# Patient Record
Sex: Female | Born: 1985
Health system: Southern US, Community
[De-identification: ages and names within clinical notes are randomized; demographics above are authoritative.]

## PROBLEM LIST (undated history)

## (undated) DIAGNOSIS — J301 Allergic rhinitis due to pollen: Secondary | ICD-10-CM

## (undated) DIAGNOSIS — K580 Irritable bowel syndrome with diarrhea: Secondary | ICD-10-CM

## (undated) DIAGNOSIS — K589 Irritable bowel syndrome without diarrhea: Secondary | ICD-10-CM

## (undated) DIAGNOSIS — F32A Depression, unspecified: Secondary | ICD-10-CM

## (undated) DIAGNOSIS — N83202 Unspecified ovarian cyst, left side: Secondary | ICD-10-CM

## (undated) DIAGNOSIS — K219 Gastro-esophageal reflux disease without esophagitis: Secondary | ICD-10-CM

## (undated) DIAGNOSIS — F419 Anxiety disorder, unspecified: Secondary | ICD-10-CM

## (undated) DIAGNOSIS — D509 Iron deficiency anemia, unspecified: Secondary | ICD-10-CM

## (undated) DIAGNOSIS — G43909 Migraine, unspecified, not intractable, without status migrainosus: Secondary | ICD-10-CM

## (undated) DIAGNOSIS — F329 Major depressive disorder, single episode, unspecified: Secondary | ICD-10-CM

## (undated) DIAGNOSIS — B019 Varicella without complication: Secondary | ICD-10-CM

## (undated) HISTORY — DX: Irritable bowel syndrome with diarrhea: K58.0

## (undated) HISTORY — PX: NO PAST SURGERIES: SHX2092

## (undated) HISTORY — DX: Depression, unspecified: F32.A

## (undated) HISTORY — DX: Allergic rhinitis due to pollen: J30.1

## (undated) HISTORY — DX: Gastro-esophageal reflux disease without esophagitis: K21.9

## (undated) HISTORY — PX: OTHER SURGICAL HISTORY: SHX169

## (undated) HISTORY — DX: Major depressive disorder, single episode, unspecified: F32.9

## (undated) HISTORY — DX: Varicella without complication: B01.9

## (undated) HISTORY — DX: Migraine, unspecified, not intractable, without status migrainosus: G43.909

## (undated) HISTORY — DX: Anxiety disorder, unspecified: F41.9

---

## 2011-08-22 ENCOUNTER — Ambulatory Visit: Payer: PRIVATE HEALTH INSURANCE

## 2011-08-22 ENCOUNTER — Ambulatory Visit (INDEPENDENT_AMBULATORY_CARE_PROVIDER_SITE_OTHER): Payer: PRIVATE HEALTH INSURANCE | Admitting: Internal Medicine

## 2011-08-22 VITALS — BP 136/81 | HR 96 | Temp 97.9°F | Resp 16 | Ht 67.0 in | Wt 128.0 lb

## 2011-08-22 DIAGNOSIS — R51 Headache: Secondary | ICD-10-CM

## 2011-08-22 MED ORDER — BUTALBITAL-APAP-CAFFEINE 50-325-40 MG PO TABS: 1.0000 | ORAL_TABLET | Freq: Four times a day (QID) | ORAL | Status: AC | PRN

## 2011-08-22 NOTE — Progress Notes (Signed)
  Subjective:    Patient ID: Kimberly Blankenship, female    DOB: 12/08/1985, 26 y.o.   MRN: [id number]  HPI 26 y/o WF presents with headache.  Behind eyes and maxillary sinuses.  History of sinus headaches, but they usually subside with Advil and Sudafed very quickly.  She has used Advil 400mg  every four hours for four days now without relief.  Also using Sudafed 60mg  q4 hours without relief.  No history of seasonal allergies.    Student, studying office management.  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  No obvious eye strain.  No blurred vision or diplopia.  Does not wear glasses.  Vision 20/15 OU today.    LMP three weeks ago, on OCP, about to start her menses.  However, she never usually gets headaches with her menses.     Review of Systems Gen:  No fever.  Healthy.  No chronic medical issues. HEENT:  No sinus pressure, no rhinorrhea, no ear pain.  No sore throat or cough. Neuro:  Positive for Headache.  No dizziness.  No vision changes.  No phonophobia, no photophobia.    Objective:   Physical Exam Gen:  Sitting comfortably on exam table.  Not in acute distress.   HEENT:  Head [place]/AT.  Eyes with full ROM, PEERL.  Ears with normal TMs without erythema or bulging.  Nose with dry irritated mucosa, without rhinorrhea or visible polyps.  Throat without erythema or tonsillar hypertrophy. Vision 20/15 OU Lungs:  Clear to auscultation bilaterally. Neuro:  CN II-XI intact.  No focal deficits.  Cerebellar function without limitations.  Sinus film:  No abnormalities. No A-F levels seen.  Septum not deviated.    Assessment & Plan:  Periorbital headache.  Normal waters view.  Question tension related headache vs. Migraine.  Trial of Esgic to use prn headache.  Stop all OTC analgesics and sudafed.  Discussed and reviewed with Dr. Merla Riches.  Doristine Church PA-C

## 2011-08-22 NOTE — Patient Instructions (Signed)
Headache Headaches are caused by many different problems. Most commonly, headache is caused by muscle tension from an injury, fatigue, or emotional upset. Excessive muscle contractions in the scalp and neck result in a headache that often feels like a tight band around the head. Tension headaches often have areas of tenderness over the scalp and the back of the neck. These headaches may last for hours, days, or longer, and some may contribute to migraines in those who have migraine problems. Migraines usually cause a throbbing headache, which is made worse by activity. Sometimes only one side of the head hurts. Nausea, vomiting, eye pain, and avoidance of food are common with migraines. Visual symptoms such as light sensitivity, blind spots, or flashing lights may also occur. Loud noises may worsen migraine headaches. Many factors may cause migraine headaches:  Emotional stress, lack of sleep, and menstrual periods.   Alcohol and some drugs (such as birth control pills).   Diet factors (fasting, caffeine, food preservatives, chocolate).   Environmental factors (weather changes, bright lights, odors, smoke).  Other causes of headaches include minor injuries to the head. Arthritis in the neck; problems with the jaw, eyes, ears, or nose are also causes of headaches. Allergies, drugs, alcohol, and exposure to smoke can also cause moderate headaches. Rebound headaches can occur if someone uses pain medications for a long period of time and then stops. Less commonly, blood vessel problems in the neck and brain (including stroke) can cause various types of headache. Treatment of headaches includes medicines for pain and relaxation. Ice packs or heat applied to the back of the head and neck help some people. Massaging the shoulders, neck and scalp are often very useful. Relaxation techniques and stretching can help prevent these headaches. Avoid alcohol and cigarette smoking as these tend to make headaches  worse. Please see your caregiver if your headache is not better in 2 days.  SEEK IMMEDIATE MEDICAL CARE IF:   You develop a high fever, chills, or repeated vomiting.   You faint or have difficulty with vision.   You develop unusual numbness or weakness of your arms or legs.   Relief of pain is inadequate with medication, or you develop severe pain.   You develop confusion, or neck stiffness.   You have a worsening of a headache or do not obtain relief.  Document Released: 06/06/2005 Document Revised: 05/26/2011 Document Reviewed: 11/30/2006 [employer] Patient Information 2012 [employer]. 

## 2012-01-23 ENCOUNTER — Ambulatory Visit (INDEPENDENT_AMBULATORY_CARE_PROVIDER_SITE_OTHER): Payer: PRIVATE HEALTH INSURANCE | Admitting: Obstetrics and Gynecology

## 2012-01-23 ENCOUNTER — Encounter: Payer: Self-pay | Admitting: Obstetrics and Gynecology

## 2012-01-23 VITALS — BP 108/60 | HR 68 | Ht 67.0 in | Wt 129.0 lb

## 2012-01-23 DIAGNOSIS — Z124 Encounter for screening for malignant neoplasm of cervix: Secondary | ICD-10-CM

## 2012-01-23 NOTE — Patient Instructions (Signed)

## 2012-01-23 NOTE — Progress Notes (Signed)
Last Pap: 2012 per pt WNL: Yes Regular Periods:yes Contraception: pill  Monthly Breast exam:no Tetanus<49yrs:yes Nl.Bladder Function:yes Daily BMs:yes Healthy Diet:yes Calcium:yes, milk Mammogram:no Date of Mammogram: n/a Exercise:yes Have often Exercise: occassionally Seatbelt: yes Abuse at home: no Stressful work:no Sigmoid-colonoscopy: n/a Bone Density: No PCP: n/a Change in PMH: n/a Change in FMH:n/a BP 108/60  Pulse 68  Ht 5\' 7"  (1.702 m)  Wt 129 lb (58.514 kg)  BMI 20.20 kg/m2  LMP 01/09/2012 BP 108/60  Pulse 68  Ht 5\' 7"  (1.702 m)  Wt 129 lb (58.514 kg)  BMI 20.20 kg/m2  LMP 01/09/2012 Pt without complaints Physical Examination: General appearance - alert, well appearing, and in no distress Mental status - normal mood, behavior, speech, dress, motor activity, and thought processes Neck - supple, no significant adenopathy, thyroid exam: thyroid is normal in size without nodules or tenderness Chest - clear to auscultation, no wheezes, rales or rhonchi, symmetric air entry Heart - normal rate and regular rhythm Abdomen - soft, nontender, nondistended, no masses or organomegaly Breasts - breasts appear normal, no suspicious masses, no skin or nipple changes or axillary nodes Pelvic - normal external genitalia, vulva, vagina, cervix, uterus and adnexa Rectal - normal rectal, no masses, rectal exam not indicated Back exam - full range of motion, no tenderness, palpable spasm or pain on motion Neurological - alert, oriented, normal speech, no focal findings or movement disorder noted Musculoskeletal - no joint tenderness, deformity or swelling Extremities - no edema, redness or tenderness in the calves or thighs Skin - normal coloration and turgor, no rashes, no suspicious skin lesions noted Routine exam Pap sent yes Mammogram due no mircette rx called in to mail order RT 1 yr

## 2012-01-24 LAB — PAP IG W/ RFLX HPV ASCU

## 2012-01-24 MED ORDER — DESOGESTREL-ETHINYL ESTRADIOL 0.15-0.02/0.01 MG (21/5) PO TABS: 1.0000 | ORAL_TABLET | Freq: Every day | ORAL | Status: DC

## 2012-01-24 NOTE — Addendum Note (Signed)
Addended by: Rolla Plate on: 01/24/2012 03:46 PM   Modules accepted: Orders

## 2012-06-22 DIAGNOSIS — G44229 Chronic tension-type headache, not intractable: Secondary | ICD-10-CM | POA: Insufficient documentation

## 2012-06-22 DIAGNOSIS — J309 Allergic rhinitis, unspecified: Secondary | ICD-10-CM | POA: Insufficient documentation

## 2012-12-25 DIAGNOSIS — Z3041 Encounter for surveillance of contraceptive pills: Secondary | ICD-10-CM | POA: Insufficient documentation

## 2013-01-27 ENCOUNTER — Ambulatory Visit: Payer: PRIVATE HEALTH INSURANCE

## 2013-01-27 ENCOUNTER — Ambulatory Visit (INDEPENDENT_AMBULATORY_CARE_PROVIDER_SITE_OTHER): Payer: PRIVATE HEALTH INSURANCE | Admitting: Family Medicine

## 2013-01-27 VITALS — BP 100/62 | HR 85 | Temp 98.1°F | Resp 16 | Ht 67.25 in | Wt 129.0 lb

## 2013-01-27 DIAGNOSIS — M79672 Pain in left foot: Secondary | ICD-10-CM

## 2013-01-27 DIAGNOSIS — M79609 Pain in unspecified limb: Secondary | ICD-10-CM

## 2013-01-27 DIAGNOSIS — T148XXA Other injury of unspecified body region, initial encounter: Secondary | ICD-10-CM

## 2013-01-27 NOTE — Progress Notes (Signed)
This 27 year old woman who struck her left tibia on the leg of a table 6 weeks ago and has had persistent pain and a lump where she struck the table. He's able to walk without difficulty but is concerned about the persistence of the swelling and pain.  Objective: No acute distress Skin: Normal Examination lower extremity shows firm 1 cm subcutaneous to his nodule medial left tibia which is tender. There is no calf tenderness. Shows full range of motion of knee and ankle.  [facility] reading (PRIMARY) by  Dr. Milus Glazier:  Normal tib fib  Assessment:  Contusion which may take 6 months to resolve.  Plan:  reassurance.

## 2013-01-27 NOTE — Patient Instructions (Addendum)
Assessment:  Contusion which may take 6 months to resolve.

## 2014-01-23 DIAGNOSIS — E559 Vitamin D deficiency, unspecified: Secondary | ICD-10-CM | POA: Insufficient documentation

## 2014-01-23 DIAGNOSIS — G8929 Other chronic pain: Secondary | ICD-10-CM | POA: Insufficient documentation

## 2014-01-23 DIAGNOSIS — M542 Cervicalgia: Secondary | ICD-10-CM | POA: Insufficient documentation

## 2014-02-18 DIAGNOSIS — F411 Generalized anxiety disorder: Secondary | ICD-10-CM | POA: Insufficient documentation

## 2014-06-06 ENCOUNTER — Ambulatory Visit (INDEPENDENT_AMBULATORY_CARE_PROVIDER_SITE_OTHER): Payer: BC Managed Care – PPO | Admitting: Family Medicine

## 2014-06-06 VITALS — BP 96/64 | HR 85 | Temp 98.0°F | Resp 16 | Ht 66.75 in | Wt 127.0 lb

## 2014-06-06 DIAGNOSIS — M779 Enthesopathy, unspecified: Secondary | ICD-10-CM

## 2014-06-06 NOTE — Progress Notes (Signed)
° °  Subjective:    Patient ID: Kimberly Blankenship, female    DOB: 04-Oct-1985, 28 y.o.   MRN: [id number] This chart was scribed for Robyn Haber, MD by Zola Button, Medical Scribe. This patient was seen in Room 13 and the patient's care was started at 2:40 PM.    HPI HPI Comments: Kimberly Blankenship is a 28 y.o. female with a hx of tendonitis who presents to the [facility] complaining of left hand pain that began a few weeks ago. She reports having a prickly sensation as well as an aching pain that gradually developed. The aching has seeming spread further into the joint per patient. She also notes that her hand feels numb. She wore a soft brace last night. Her last flare-up of tendonitis was about 1.5 years ago. She normally takes Ibuprofen for tendonitis flare-ups; she also takes diclofenac for other pain that she has. Patient denies any known falls or tripping.  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  Review of Systems  Musculoskeletal: Positive for myalgias and arthralgias.  Neurological: Positive for numbness.       Objective:   Physical Exam CONSTITUTIONAL: Well developed/well nourished HEAD: Normocephalic/atraumatic EYES: EOM/PERRL ENMT: Mucous membranes moist NECK: supple no meningeal signs SPINE: entire spine nontender CV: S1/S2 noted, no murmurs/rubs/gallops noted LUNGS: Lungs are clear to auscultation bilaterally, no apparent distress ABDOMEN: soft, nontender, no rebound or guarding GU: no cva tenderness NEURO: Pt is awake/alert, moves all extremitiesx4 EXTREMITIES: pulses normal SKIN: warm, color normal PSYCH: no abnormalities of mood noted  Full range of motion of the left thumb. There is some mild swelling on the volar aspect of the PIP joint of the thumb. Patient has had history of tendinitis      Assessment & Plan:   This chart was scribed in my presence and reviewed by me personally.    ICD-9-CM ICD-10-CM   1. Tendonitis  726.90 M77.9    Patient is to take diclofenac which showed he has twice a day. If the paresthesias and hyperesthesia do not clear after 3 or 4 days, either return or let us know so we can take appropriate next.   Signed, Robyn Haber, MD

## 2014-06-06 NOTE — Patient Instructions (Signed)
The paresthesias (hyperesthesia) that you're feeling in the thumb are most consistent with tendinitis and adjacent nerve irritation. And start the diclofenac again and if it's not better in 3 or 4 days let us know.

## 2015-12-30 DIAGNOSIS — Z91038 Other insect allergy status: Secondary | ICD-10-CM | POA: Insufficient documentation

## 2016-06-20 DIAGNOSIS — R5383 Other fatigue: Secondary | ICD-10-CM | POA: Insufficient documentation

## 2016-11-03 ENCOUNTER — Encounter: Payer: Self-pay | Admitting: Emergency Medicine

## 2016-11-03 ENCOUNTER — Ambulatory Visit (INDEPENDENT_AMBULATORY_CARE_PROVIDER_SITE_OTHER): Payer: BLUE CROSS/BLUE SHIELD | Admitting: Emergency Medicine

## 2016-11-03 ENCOUNTER — Ambulatory Visit (INDEPENDENT_AMBULATORY_CARE_PROVIDER_SITE_OTHER): Payer: BLUE CROSS/BLUE SHIELD

## 2016-11-03 VITALS — BP 121/71 | HR 64 | Temp 98.1°F | Resp 17 | Ht 66.75 in | Wt 137.0 lb

## 2016-11-03 DIAGNOSIS — M79675 Pain in left toe(s): Secondary | ICD-10-CM

## 2016-11-03 NOTE — Patient Instructions (Signed)
     IF you received an x-ray today, you will receive an invoice from [facility]. Please contact [facility] at [phone] with questions or concerns regarding your invoice.   IF you received labwork today, you will receive an invoice from [facility]. Please contact [facility] at [phone] with questions or concerns regarding your invoice.   Our billing staff will not be able to assist you with questions regarding bills from these companies.  You will be contacted with the lab results as soon as they are available. The fastest way to get your results is to activate your My Chart account. Instructions are located on the last page of this paperwork. If you have not heard from us regarding the results in 2 weeks, please contact this office.     

## 2016-11-03 NOTE — Progress Notes (Signed)
Kimberly Blankenship 31 y.o.   Chief Complaint  Patient presents with  . Foot Pain    Big toe on left foot onset 1 month    HISTORY OF PRESENT ILLNESS: This is a 31 y.o. female complaining of pain to left big toe for 1 month; frequent hiker; walks a lot; no chronic medical condition.  HPI   Prior to Admission medications   Medication Sig Start Date End Date Taking? Authorizing Provider  ACETAMINOPHEN-BUTALBITAL 50-325 MG TABS TAKE ONE TABLET BY MOUTH EVERY 4 (FOUR) HOURS AS NEEDED. 03/07/16  Yes [provider]  clonazePAM (KLONOPIN) 0.5 MG tablet Take 0.5 mg by mouth 2 (two) times daily as needed for anxiety.   Yes [provider]  desogestrel-ethinyl estradiol (KARIVA,AZURETTE,MIRCETTE) 0.15-0.02/0.01 MG (21/5) tablet Take 1 tablet by mouth daily.   Yes [provider]  Desogestrel-Ethinyl Estradiol (VIORELE PO) Take 1 tablet by mouth daily.   Yes [provider]  MAGNESIUM PO Take by mouth.   Yes [provider]    Allergies  Allergen Reactions  . Sulfa Drugs Cross Reactors Rash    There are no active problems to display for this patient.   Past Medical History:  Diagnosis Date  . Headache, migraine     Past Surgical History:  Procedure Laterality Date  . wisdom teeth      Social History   Social History  . Marital status: Married    Spouse name: N/A  . Number of children: N/A  . Years of education: N/A   Occupational History  . Not on file.   Social History Main Topics  . Smoking status: Never Smoker  . Smokeless tobacco: Never Used  . Alcohol use 0.5 oz/week    1 Standard drinks or equivalent per week  . Drug use: No  . Sexual activity: Yes    Birth control/ protection: Pill     Comment: viorelle   Other Topics Concern  . Not on file   Social History Narrative  . No narrative on file    Family History  Problem Relation Age of Onset  . Migraines Mother      Review of Systems  Constitutional:  Negative.  Negative for chills and fever.  HENT: Negative.   Eyes: Negative.   Respiratory: Negative for cough and shortness of breath.   Cardiovascular: Negative for chest pain, palpitations and leg swelling.  Gastrointestinal: Negative for abdominal pain, nausea and vomiting.  Genitourinary: Negative for dysuria and hematuria.  Musculoskeletal: Positive for joint pain (left great toe).  Skin: Negative.  Negative for rash.  Neurological: Negative.   Endo/Heme/Allergies: Negative.   All other systems reviewed and are negative.  Vitals:   11/03/16 0914  BP: 121/71  Pulse: 64  Resp: 17  Temp: 98.1 F (36.7 C)     Physical Exam  Constitutional: She is oriented to person, place, and time. She appears well-developed and well-nourished.  HENT:  Head: Normocephalic and atraumatic.  Eyes: EOM are normal. Pupils are equal, round, and reactive to light.  Neck: Normal range of motion. Neck supple.  Cardiovascular: Normal rate and regular rhythm.   Pulmonary/Chest: Effort normal.  Musculoskeletal:  Left great toe: no erythema, mild swelling, no bruising; mild tenderness to IP joint; no tendon tenderness; no gout. NVI  Neurological: She is alert and oriented to person, place, and time. No sensory deficit. She exhibits normal muscle tone.  Skin: Skin is warm and dry. Capillary refill takes less than 2 seconds. No rash noted.  Psychiatric: She has a normal mood and affect. Her behavior is normal.  Vitals reviewed.  Dg Toe Great Left  Result Date: 11/03/2016 CLINICAL DATA:  Pain for 1 month EXAM: LEFT FIRST TOE:  3 V COMPARISON:  None. FINDINGS: Frontal, oblique, and lateral views obtained. No fracture or dislocation. Joint spaces appear normal. No erosive change. No radiopaque foreign body. IMPRESSION: No fracture or dislocation.  No appreciable arthropathic change. Electronically Signed   By: Lowella Grip III M.D.   On: 11/03/2016 10:16     ASSESSMENT & PLAN: Kimberly Blankenship was seen  today for foot pain.  Diagnoses and all orders for this visit:  Great toe pain, left -     CBC with Differential/Platelet -     Basic metabolic panel -     Uric Acid -     DG Toe Great Left; Future -     Ambulatory referral to [place], MD [facility]

## 2016-11-04 LAB — BASIC METABOLIC PANEL
BUN/Creatinine Ratio: 12 (ref 9–23)
BUN: 11 mg/dL (ref 6–20)
CHLORIDE: 101 mmol/L (ref 96–106)
CO2: 23 mmol/L (ref 18–29)
Calcium: 9.1 mg/dL (ref 8.7–10.2)
Creatinine, Ser: 0.9 mg/dL (ref 0.57–1.00)
GFR, EST AFRICAN AMERICAN: 101 mL/min/{1.73_m2} (ref >59)
GFR, EST NON AFRICAN AMERICAN: 87 mL/min/{1.73_m2} (ref >59)
Glucose: 91 mg/dL (ref 65–99)
Potassium: 4.3 mmol/L (ref 3.5–5.2)
SODIUM: 138 mmol/L (ref 134–144)

## 2016-11-04 LAB — CBC WITH DIFFERENTIAL/PLATELET
BASOS: 0 %
Basophils Absolute: 0 10*3/uL (ref 0.0–0.2)
EOS (ABSOLUTE): 0 10*3/uL (ref 0.0–0.4)
EOS: 1 %
HEMATOCRIT: 39 % (ref 34.0–46.6)
Hemoglobin: 13.4 g/dL (ref 11.1–15.9)
IMMATURE GRANULOCYTES: 0 %
Immature Grans (Abs): 0 10*3/uL (ref 0.0–0.1)
LYMPHS ABS: 2 10*3/uL (ref 0.7–3.1)
Lymphs: 27 %
MCH: 29.6 pg (ref 26.6–33.0)
MCHC: 34.4 g/dL (ref 31.5–35.7)
MCV: 86 fL (ref 79–97)
MONOS ABS: 0.4 10*3/uL (ref 0.1–0.9)
Monocytes: 6 %
NEUTROS ABS: 5 10*3/uL (ref 1.4–7.0)
NEUTROS PCT: 66 %
Platelets: 235 10*3/uL (ref 150–379)
RBC: 4.53 x10E6/uL (ref 3.77–5.28)
RDW: 12.7 % (ref 12.3–15.4)
WBC: 7.5 10*3/uL (ref 3.4–10.8)

## 2016-11-04 LAB — URIC ACID: Uric Acid: 3.8 mg/dL (ref 2.5–7.1)

## 2016-11-28 ENCOUNTER — Ambulatory Visit (INDEPENDENT_AMBULATORY_CARE_PROVIDER_SITE_OTHER): Payer: BLUE CROSS/BLUE SHIELD

## 2016-11-28 ENCOUNTER — Ambulatory Visit (INDEPENDENT_AMBULATORY_CARE_PROVIDER_SITE_OTHER): Payer: BLUE CROSS/BLUE SHIELD | Admitting: Podiatry

## 2016-11-28 ENCOUNTER — Encounter: Payer: Self-pay | Admitting: Podiatry

## 2016-11-28 VITALS — Resp 16 | Ht 67.0 in | Wt 135.0 lb

## 2016-11-28 DIAGNOSIS — M79675 Pain in left toe(s): Secondary | ICD-10-CM

## 2016-11-28 DIAGNOSIS — M779 Enthesopathy, unspecified: Secondary | ICD-10-CM

## 2016-11-28 DIAGNOSIS — M7752 Other enthesopathy of left foot: Secondary | ICD-10-CM

## 2016-11-28 DIAGNOSIS — M778 Other enthesopathies, not elsewhere classified: Secondary | ICD-10-CM

## 2016-11-28 MED ORDER — DICLOFENAC SODIUM 75 MG PO TBEC: 75.0000 mg | DELAYED_RELEASE_TABLET | Freq: Two times a day (BID) | ORAL | 2 refills | Status: DC

## 2016-11-28 MED ORDER — TRIAMCINOLONE ACETONIDE 10 MG/ML IJ SUSP
10.0000 mg | Freq: Once | INTRAMUSCULAR | Status: AC
  Administered 2016-11-28: 10 mg

## 2016-11-28 NOTE — Progress Notes (Signed)
   Subjective:    Patient ID: Kimberly Blankenship, female    DOB: [birth year], 31 y.o.   MRN: [id number]  HPI Chief Complaint  Patient presents with  . Foot Pain    Left foot; great toe-joint; pt stated, "Thinks going up and down stairs have irritated toe"; x2 months      Review of Systems  Musculoskeletal: Positive for arthralgias.  Hematological: Bruises/bleeds easily.  All other systems reviewed and are negative.      Objective:   Physical Exam        Assessment & Plan:

## 2016-11-28 NOTE — Progress Notes (Signed)
Subjective:    Patient ID: Kimberly Blankenship, female   DOB: 31 y.o.   MRN: [id number]   HPI patient presents stating she's get a lot of pain in the joint of her left big toe of several months and does not remember specific injury    Review of Systems  All other systems reviewed and are negative.       Objective:  Physical Exam  Cardiovascular: Intact distal pulses.   Musculoskeletal: Normal range of motion.  Neurological: She is alert.  Skin: Skin is warm.  Nursing note and vitals reviewed.  neurovascular status intact muscle strength was adequate range of motion within normal limits with patient found to have quite a bit of discomfort on the lateral side of the left first MPJ with fluid buildup noted around the joint surface and no restriction of motion with no crepitus or other indications of hallux rigidus limitus condition     Assessment:    Appears to be inflammatory capsulitis of the first MPJ left with no current indications of hallux rigidus or limitus condition     Plan:    H&P reviewed with patient and at this time I did do a careful injection of the lateral joint surface 3 mg Kenalog 5 mg Xylocaine instructed on physical therapy anti-inflammatories and rigid bottom shoes. Reappoint to recheck again in the next several weeks and also placed on diclofenac 75 mg twice a day  X-rays indicate that there is no signs of there is a narrowing of the joint surface fracture or spur formation consistent with structural hallux limitus

## 2016-12-19 ENCOUNTER — Ambulatory Visit: Payer: BLUE CROSS/BLUE SHIELD | Admitting: Podiatry

## 2017-01-18 DIAGNOSIS — E162 Hypoglycemia, unspecified: Secondary | ICD-10-CM | POA: Insufficient documentation

## 2017-01-30 ENCOUNTER — Encounter: Payer: Self-pay | Admitting: Physician Assistant

## 2017-01-30 ENCOUNTER — Ambulatory Visit (INDEPENDENT_AMBULATORY_CARE_PROVIDER_SITE_OTHER): Payer: BLUE CROSS/BLUE SHIELD | Admitting: Physician Assistant

## 2017-01-30 VITALS — BP 126/88 | HR 80 | Temp 97.5°F | Resp 18 | Ht 67.0 in | Wt 135.4 lb

## 2017-01-30 DIAGNOSIS — E538 Deficiency of other specified B group vitamins: Secondary | ICD-10-CM | POA: Insufficient documentation

## 2017-01-30 DIAGNOSIS — R202 Paresthesia of skin: Secondary | ICD-10-CM | POA: Diagnosis not present

## 2017-01-30 NOTE — Patient Instructions (Addendum)
Your physical exam findings are reassuring. You are likely experiencing normal response to cutting off the nerve when you are sitting and sleeping. I have given you some information about ulnar nerve and capral tunnel syndrome to read below. I do recommend wearing compression stockings at work, doing occasional calf raises at work while sitting, increasing water to 64 oz daily, and adding a multivitamin to your diet. We will contact you with your lab results within the next week.  Please seek care sooner if any of your symptom worsen or you develop new concerning symptoms. Thank you for letting me participate in your health and well being.   Paresthesia Paresthesia is a burning or prickling feeling. This feeling can happen in any part of the body. It often happens in the hands, arms, legs, or feet. Usually, it is not painful. In most cases, the feeling goes away in a short time and is not a sign of a serious problem. Follow these instructions at home:  Avoid drinking alcohol.  Try massage or needle therapy (acupuncture) to help with your problems.  Keep all follow-up visits as told by your doctor. This is important. Contact a doctor if:  You keep on having episodes of paresthesia.  Your burning or prickling feeling gets worse when you walk.  You have pain or cramps.  You feel dizzy.  You have a rash. Get help right away if:  You feel weak.  You have trouble walking or moving.  You have problems speaking, understanding, or seeing.  You feel confused.  You cannot control when you pee (urinate) or poop (bowel movement).  You lose feeling (numbness) after an injury.  You pass out (faint). This information is not intended to replace advice given to you by your health care provider. Make sure you discuss any questions you have with your health care provider. Document Released: 05/19/2008 Document Revised: 11/12/2015 Document Reviewed: 06/02/2014 Elsevier Interactive Patient  Education  2018 [place] Tunnel Syndrome Cubital tunnel syndrome is a condition that causes pain and weakness of the forearm and hand. This condition happens when one of the nerves (ulnar nerve) that runs alongside the elbow joint becomes irritated. What are the causes? Causes of this condition include:  Increased pressure on the ulnar nerve at the elbow, arm, or forearm. This can be caused by: ? Swollen tissues. ? Ligaments. ? Muscles. ? Poorly healed elbow fractures. ? Tumors in the elbow. These are usually noncancerous (benign). ? Scar tissue that develops in the elbow after an injury. ? Bony growths (spurs) near the ulnar nerve.  Stretching of the nerve due to loose elbow ligaments.  Trauma to the nerve at the elbow.  Repetitive elbow bending.  Certain medical conditions.  What increases the risk? This condition is more likely to develop in:  People who do manual labor that requires frequently bending the elbow.  People who play sports that include repeated or strenuous throwing motions, such as baseball.  People who play contact sports, such as football or lacrosse.  People who do not warm up properly before activities.  People who have diabetes.  People who have an underactive thyroid (hypothyroidism).  What are the signs or symptoms? Symptoms of this condition include:  Clumsiness or weakness of the hand.  Tenderness of the inner elbow.  Aching or soreness of the inner elbow, forearm, or fingers, especially the little finger or the ring finger.  Increased pain with forced elbow bending.  Reduced control when  throwing.  Tingling, numbness, or burning inside the forearm, or in part of the hand or fingers, especially the little finger or the ring finger.  Sharp pains that shoot from the elbow down to the wrist and hand.  The inability to grip or pinch hard.  How is this diagnosed? This condition is diagnosed with a medical history  and physical exam. Your health care provider will ask about your symptoms and ask for details about any injury. You may also have other tests, including:  Electromyogram (EMG). This test checks how well the nerve is working.  X-ray.  How is this treated? Treatment starts by stopping the activities that are causing your symptoms to get worse. Treatment may include the use of icing and medicines to reduce pain and swelling. You may also be advised to wear a splint to prevent your elbow from bending or wear an elbow pad where the ulnar nerve is closest to the skin. In less severe cases, treatment may also include working with a physical therapist:  To help decrease your symptoms.  To improve the strength and range of motion of your elbow, forearm, and hand.  If the treatments described above do not help, surgery may be needed. Follow these instructions at home: If you have a splint:  Wear it as told by your health care provider. Remove it only as told by your health care provider.  Loosen the splint if your fingers become numb and tingle, or if they turn cold and blue.  Keep the splint clean and dry. Managing pain, stiffness, and swelling  If directed, apply ice to the injured area: ? Put ice in a plastic bag. ? Place a towel between your skin and the bag. ? Leave the ice on for 20 minutes, 2-3 times per day.  Move your fingers often to avoid stiffness and to lessen swelling.  Raise (elevate) the injured area above the level of your heart while you are sitting or lying down. General instructions  Take over-the-counter and prescription medicines only as told by your health care provider.  Keep all follow-up visits as told by your health care provider. This is important.  Do any exercise or physical therapy as told by your health care provider.  Do not drive or operate heavy machinery while taking prescription pain medicine.  If you were given an elbow pad, wear it as told by  your health care provider. Contact a health care provider if:  Your symptoms get worse.  Your symptoms do not get better with treatment.  Your have new pain.  Your hand on the injured side feels numb or cold. This information is not intended to replace advice given to you by your health care provider. Make sure you discuss any questions you have with your health care provider. Document Released: 06/06/2005 Document Revised: 11/12/2015 Document Reviewed: 08/13/2014 Elsevier Interactive Patient Education  2018 [place] Syndrome Carpal tunnel syndrome is a condition that causes pain in your hand and arm. The carpal tunnel is a narrow area that is on the palm side of your wrist. Repeated wrist motion or certain diseases may cause swelling in the tunnel. This swelling can pinch the main nerve in the wrist (median nerve). Follow these instructions at home: If you have a splint:  Wear it as told by your doctor. Remove it only as told by your doctor.  Loosen the splint if your fingers: ? Become numb and tingle. ? Turn blue and  cold.  Keep the splint clean and dry. General instructions  Take over-the-counter and prescription medicines only as told by your doctor.  Rest your wrist from any activity that may be causing your pain. If needed, talk to your employer about changes that can be made in your work, such as getting a wrist pad to use while typing.  If directed, apply ice to the painful area: ? Put ice in a plastic bag. ? Place a towel between your skin and the bag. ? Leave the ice on for 20 minutes, 2-3 times per day.  Keep all follow-up visits as told by your doctor. This is important.  Do any exercises as told by your doctor, physical therapist, or occupational therapist. Contact a doctor if:  You have new symptoms.  Medicine does not help your pain.  Your symptoms get worse. This information is not intended to replace advice given to you by  your health care provider. Make sure you discuss any questions you have with your health care provider. Document Released: 05/26/2011 Document Revised: 11/12/2015 Document Reviewed: 10/22/2014 Elsevier Interactive Patient Education  [employer].

## 2017-01-30 NOTE — Progress Notes (Signed)
Kimberly Blankenship  MRN: [id number] DOB: 08-Nov-1985  Subjective:  Kimberly Blankenship is a 31 y.o. female seen in office today for a chief complaint of circulation issues x months.  Bilateral arm numbness: Numbness and tingling will ocur while sleeping occasionally. She will also will have bilateral tingling in her fingers if she is at work for too long. 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 The sensation will resolve with rest.   Bilateral leg numbness: Will occasionally have numbness and tingling in legs if she sits in a crossed leg position for a while.  This sensation also resolves if she uncrosses her legs.   Also, she occasionally has muscle cramps in bilateral calves. Denies increase in physical activity, cold intolerance, heat intolerance, lower leg swelling, skin discoloration, redness, and warmth.  Denies smoking. No recent immobilizations. No PMH of anemia, bleeding disorder, blood clots, and cancer. Of note, pt does have anxiety. Used to take zoloft but stopped taking it. Controlled on klonopin as needed. Had to take one this morning because she said she is really stressed at work and this worsens her anxiety.   Diet consists of sandwiches, burger, pizza, pasta, and fast food. Limited vegetables and fruits. Drinks tea. Will drink some water but not a lot. No multivitamin.           No FH of heart disease, stroke, blood clots, bleeding disorders in the family.   PCP is at [facility]. She has appointment with them next month.   Review of Systems  Constitutional: Negative for chills, diaphoresis, fatigue and fever.  Eyes: Negative for visual disturbance.  Respiratory: Negative for cough and shortness of breath.   Cardiovascular: Negative for chest pain and palpitations.  Gastrointestinal: Positive for nausea.  Endocrine: Negative for polydipsia, polyphagia and polyuria.  Genitourinary: Negative for difficulty urinating.  Neurological: Negative for dizziness, speech  difficulty, light-headedness and headaches.  Hematological: Bruises/bleeds easily.    Patient Active Problem List   Diagnosis Date Noted  . Great toe pain, left 11/03/2016    Current Outpatient Prescriptions on File Prior to Visit  Medication Sig Dispense Refill  . ACETAMINOPHEN-BUTALBITAL 50-325 MG TABS TAKE ONE TABLET BY MOUTH EVERY 4 (FOUR) HOURS AS NEEDED.    . clonazePAM (KLONOPIN) 0.5 MG tablet Take 0.5 mg by mouth 2 (two) times daily as needed for anxiety.    Marland Kitchen desogestrel-ethinyl estradiol (KARIVA,AZURETTE,MIRCETTE) 0.15-0.02/0.01 MG (21/5) tablet Take 1 tablet by mouth daily.    Marland Kitchen MAGNESIUM PO Take by mouth.    . Desogestrel-Ethinyl Estradiol (VIORELE PO) Take 1 tablet by mouth daily.    . diclofenac (VOLTAREN) 75 MG EC tablet Take 1 tablet (75 mg total) by mouth 2 (two) times daily. (Patient not taking: Reported on 01/30/2017) 50 tablet 2   No current facility-administered medications on file prior to visit.     Allergies  Allergen Reactions  . Sulfa Drugs Cross Reactors Rash        Objective:  BP 126/88 (BP Location: Right Arm, Patient Position: Sitting, Cuff Size: Normal)   Pulse 80   Temp (!) 97.5 F (36.4 C) (Oral)   Resp 18   Ht _0  (1.702 m)   Wt 135 lb 6.4 oz (61.4 kg)   LMP 12/30/2016   SpO2 97%   BMI 21.21 kg/m   Physical Exam  Constitutional: She is oriented to person, place, and time and well-developed, well-nourished, and in no distress.  HENT:  Head: Normocephalic and atraumatic.  Eyes: Conjunctivae  are normal.  Neck: Normal range of motion.  Cardiovascular: Normal rate, regular rhythm and normal heart sounds.   Pulses:      Radial pulses are 2+ on the right side, and 2+ on the left side.       Popliteal pulses are 2+ on the right side, and 2+ on the left side.       Dorsalis pedis pulses are 2+ on the right side, and 2+ on the left side.       Posterior tibial pulses are 2+ on the right side, and 2+ on the left side.  Pulmonary/Chest:  Effort normal.  Musculoskeletal: Normal range of motion.       Right lower leg: She exhibits no tenderness and no swelling.       Left lower leg: She exhibits no tenderness and no swelling.  Calf measures 35 cm bilaterally.   Neurological: She is alert and oriented to person, place, and time. She has normal sensation, normal strength and intact cranial nerves. She displays no atrophy, no tremor, facial symmetry and normal speech. Gait normal.  Reflex Scores:      Tricep reflexes are 2+ on the right side and 2+ on the left side.      Bicep reflexes are 2+ on the right side and 2+ on the left side.      Brachioradialis reflexes are 2+ on the right side and 2+ on the left side.      Patellar reflexes are 2+ on the right side and 2+ on the left side.      Achilles reflexes are 2+ on the right side and 2+ on the left side. Positive Phalen's test in left hand. Negative Tinel's sign and elbow and wrist.   Skin: Skin is warm and dry. No cyanosis.  Bilateral hands with cap refill <2 sec.   Psychiatric: Affect normal. Her mood appears anxious.  Vitals reviewed.   Assessment and Plan :   1. Paresthesias Labs pending. Will fax results to PCP. PE findings are reassuring, no acute findings. Pt reassured that her description of leg symptoms seem to be a normal response that occurs when individuals sit in a crossed legged position for too long. Arm and hand symptoms during sleep may be consistent with ulnar nerve entrapment and/or carpal tunnel syndrome. She does a lot of typing at her job and had positive phalen's test in left hand. Given education on night splints to use for carpal tunnel syndrome and ulnar nerve entrapment. Also encouraged to increase water consumption and start taking multivitamin daily. Return to clinic if symptoms worsen, do not improve, or as needed. - CBC with Differential/Platelet - CMP14+EGFR - CK - EKG 12-Lead - Vitamin B12 - TSH  Tenna Delaine, PA-C  Primary [facility] at  [place] 02/01/2017 6:09 PM

## 2017-01-31 LAB — CBC WITH DIFFERENTIAL/PLATELET
BASOS ABS: 0 10*3/uL (ref 0.0–0.2)
Basos: 0 %
EOS (ABSOLUTE): 0 10*3/uL (ref 0.0–0.4)
EOS: 0 %
HEMOGLOBIN: 14.3 g/dL (ref 11.1–15.9)
Hematocrit: 42.4 % (ref 34.0–46.6)
IMMATURE GRANULOCYTES: 0 %
Immature Grans (Abs): 0 10*3/uL (ref 0.0–0.1)
LYMPHS ABS: 1.5 10*3/uL (ref 0.7–3.1)
LYMPHS: 17 %
MCH: 29.9 pg (ref 26.6–33.0)
MCHC: 33.7 g/dL (ref 31.5–35.7)
MCV: 89 fL (ref 79–97)
MONOCYTES: 3 %
Monocytes Absolute: 0.3 10*3/uL (ref 0.1–0.9)
NEUTROS PCT: 80 %
Neutrophils Absolute: 6.9 10*3/uL (ref 1.4–7.0)
Platelets: 291 10*3/uL (ref 150–379)
RBC: 4.78 x10E6/uL (ref 3.77–5.28)
RDW: 13.4 % (ref 12.3–15.4)
WBC: 8.7 10*3/uL (ref 3.4–10.8)

## 2017-01-31 LAB — CMP14+EGFR
ALBUMIN: 4.8 g/dL (ref 3.5–5.5)
ALK PHOS: 44 IU/L (ref 39–117)
ALT: 10 IU/L (ref 0–32)
AST: 14 IU/L (ref 0–40)
Albumin/Globulin Ratio: 1.6 (ref 1.2–2.2)
BUN/Creatinine Ratio: 12 (ref 9–23)
BUN: 10 mg/dL (ref 6–20)
Bilirubin Total: 0.2 mg/dL (ref 0.0–1.2)
CALCIUM: 10.1 mg/dL (ref 8.7–10.2)
CO2: 20 mmol/L (ref 20–29)
CREATININE: 0.86 mg/dL (ref 0.57–1.00)
Chloride: 103 mmol/L (ref 96–106)
GFR calc Af Amer: 106 mL/min/{1.73_m2} (ref >59)
GFR, EST NON AFRICAN AMERICAN: 92 mL/min/{1.73_m2} (ref >59)
GLUCOSE: 83 mg/dL (ref 65–99)
Globulin, Total: 3 g/dL (ref 1.5–4.5)
Potassium: 4 mmol/L (ref 3.5–5.2)
Sodium: 141 mmol/L (ref 134–144)
Total Protein: 7.8 g/dL (ref 6.0–8.5)

## 2017-01-31 LAB — CK: CK TOTAL: 68 U/L (ref 24–173)

## 2017-01-31 LAB — VITAMIN B12: Vitamin B-12: 194 pg/mL — ABNORMAL LOW (ref 232–1245)

## 2017-02-01 ENCOUNTER — Ambulatory Visit: Payer: Self-pay | Admitting: Medical

## 2017-02-01 DIAGNOSIS — F410 Panic disorder [episodic paroxysmal anxiety] without agoraphobia: Secondary | ICD-10-CM | POA: Insufficient documentation

## 2017-02-08 ENCOUNTER — Ambulatory Visit: Payer: BLUE CROSS/BLUE SHIELD | Admitting: Podiatry

## 2017-02-09 ENCOUNTER — Encounter: Payer: Self-pay | Admitting: Physician Assistant

## 2017-02-09 ENCOUNTER — Telehealth: Payer: Self-pay | Admitting: Physician Assistant

## 2017-02-09 NOTE — Telephone Encounter (Signed)
Called patient to discuss lab results. Left voicemail. All labs except for vitamin b12 were normal. Vitamin b12 was low. I recommend starting supplementation. However, I do know she has a PCP at [facility] and was planning on seeing them in September. I am happy to fax the results to their office so they can proceed with treatment or we can initiate treatment. Either way, I have asked her to contact our office back and let us know what she would like to do.

## 2017-02-13 ENCOUNTER — Telehealth: Payer: Self-pay | Admitting: Physician Assistant

## 2017-02-13 NOTE — Telephone Encounter (Signed)
Pt returned originally phone call. She has appointment with PCP in 2 weeks. She wants to go ahead and start supplementing with B12. She was questioning what dose. I informed her that she can take 1,000-2,000 daily for the next two weeks then have her PCP recheck her B12 values to determine further dosing instructions. Pt understands and agrees to plan.

## 2017-02-16 LAB — SPECIMEN STATUS REPORT

## 2017-02-16 LAB — METHYLMALONIC ACID, SERUM: Methylmalonic Acid: 120 nmol/L (ref 0–378)

## 2017-02-16 LAB — HOMOCYSTEINE: Homocysteine: 9.3 umol/L (ref 0.0–15.0)

## 2017-03-03 ENCOUNTER — Encounter: Payer: Self-pay | Admitting: Podiatry

## 2017-03-03 ENCOUNTER — Other Ambulatory Visit: Payer: Self-pay | Admitting: Podiatry

## 2017-03-03 ENCOUNTER — Ambulatory Visit (INDEPENDENT_AMBULATORY_CARE_PROVIDER_SITE_OTHER): Payer: BLUE CROSS/BLUE SHIELD | Admitting: Podiatry

## 2017-03-03 ENCOUNTER — Ambulatory Visit (INDEPENDENT_AMBULATORY_CARE_PROVIDER_SITE_OTHER): Payer: BLUE CROSS/BLUE SHIELD

## 2017-03-03 DIAGNOSIS — M7752 Other enthesopathy of left foot: Secondary | ICD-10-CM

## 2017-03-03 DIAGNOSIS — M779 Enthesopathy, unspecified: Secondary | ICD-10-CM

## 2017-03-03 DIAGNOSIS — M205X1 Other deformities of toe(s) (acquired), right foot: Secondary | ICD-10-CM

## 2017-03-03 DIAGNOSIS — M79672 Pain in left foot: Secondary | ICD-10-CM

## 2017-03-03 DIAGNOSIS — M778 Other enthesopathies, not elsewhere classified: Secondary | ICD-10-CM

## 2017-03-03 NOTE — Progress Notes (Signed)
Subjective:    Patient ID: Kimberly Blankenship, female   DOB: 31 y.o.   MRN: [id number]   HPI patient states still getting quite a bit of discomfort in the left big toe and around the area and I thought it was improving after medication but it seems like it's just come back not severe but bothersome    ROS      Objective:  Physical Exam neurovascular status intact with patient having excellent range of motion first MPJ but some discomfort on the lateral side of the joint and slightly proximal upon palpation with no crepitus indicate     Assessment:   Possibility for functional hallux limitus condition left      Plan:     Reviewed condition and at this point I recommended orthotics with probable reverse Morton's extension or medical edge type device to try to reduce stress against the first MPJ with consideration for immobilization if symptoms persist  X-rays do not indicate any change from previous as far as x-rays go

## 2017-03-07 ENCOUNTER — Ambulatory Visit: Payer: BLUE CROSS/BLUE SHIELD | Admitting: Orthotics

## 2017-03-07 DIAGNOSIS — M79672 Pain in left foot: Secondary | ICD-10-CM

## 2017-03-07 DIAGNOSIS — M205X1 Other deformities of toe(s) (acquired), right foot: Secondary | ICD-10-CM

## 2017-03-07 DIAGNOSIS — M779 Enthesopathy, unspecified: Secondary | ICD-10-CM

## 2017-03-07 DIAGNOSIS — M778 Other enthesopathies, not elsewhere classified: Secondary | ICD-10-CM

## 2017-03-07 NOTE — Progress Notes (Signed)
Patient is here today to be evaluated and cast for CMFO.  Patient has hx of functional hallux limitus (FHL), and needs a supportive orthoses that will plantarflex first ray in order to lower hinge pin of first MPJ and enhance windless effect.  Plan of deep heel cup, hug arch, and reverse mortons extension.  Richy to fab.   Patient to pay $300.00 self pay instead of filing to insurance due high deductible.

## 2017-03-13 ENCOUNTER — Encounter: Payer: Self-pay | Admitting: Physician Assistant

## 2017-03-24 DIAGNOSIS — R894 Abnormal immunological findings in specimens from other organs, systems and tissues: Secondary | ICD-10-CM | POA: Insufficient documentation

## 2017-03-24 DIAGNOSIS — R7989 Other specified abnormal findings of blood chemistry: Secondary | ICD-10-CM | POA: Insufficient documentation

## 2017-03-28 ENCOUNTER — Encounter: Payer: Self-pay | Admitting: Physician Assistant

## 2017-04-03 ENCOUNTER — Other Ambulatory Visit: Payer: Self-pay | Admitting: Orthotics

## 2017-04-03 DIAGNOSIS — M79676 Pain in unspecified toe(s): Secondary | ICD-10-CM

## 2017-04-29 DIAGNOSIS — K219 Gastro-esophageal reflux disease without esophagitis: Secondary | ICD-10-CM | POA: Insufficient documentation

## 2017-05-03 NOTE — Progress Notes (Signed)
[place] at [facility] [address], [place], [place] [address] [phone]  Date:  05/04/2017   Name:  Kimberly Blankenship   DOB:  11/21/1985   MRN:  [id number]  PCP:  Darreld Mclean, MD    Chief Complaint: Establish Care (Pt here to est care. Would like to discuss acid reflux, go over lab results brought in by pt, and c/o fatigue x 1 yr. )   History of Present Illness:  Kimberly Blankenship is a 31 y.o. very pleasant female patient who presents with the following:  Here today as a new patient to establish care She actually had a CPE per Dr. Sabra Heck in September of this year:  Patient presents with  . Annual Exam  not fasting  Last pap normal in 2017. Monogamous, declines STD testing.  Desires flu vaccine. Tdap up to date.  Her migraine headaches are occasional and relieved by BuPAP.  Since her last visit she has been diagnosed with panic attacks and has a small Rx for Xanax which she uses about 1 time per week. She has been seen by [facility] and diagnosed with B12 deficiency. She has had very recent labs including CBC, CMP, TSH B12 and folate. She will bring [place] a copy of those results. She has been taking B12, lemon balm and methylfolate. She has follow up scheduled. She has not had any counseling for her anxiety or panic but would be willing. She is trying to make changes to her diet and increase exercise to improve her symptoms.   Patient Active Problem List  Diagnosis Date Noted  . Panic attacks 02/01/2017  . B12 deficiency 01/30/2017  . Great toe pain, left 11/03/2016  . GAD (generalized anxiety disorder) 02/18/2014  failed celexa 02/2014, prn klonopin.  . Chronic neck pain 01/23/2014  Mild DJD cervical by xrays 12/2013  . Vitamin D deficiency 01/23/2014  . Oral contraceptive use 12/25/2012  . Allergic rhinitis 06/22/2012  . Chronic tension headaches 06/22/2012  . Migraine  Never before headache; never brain  image; + FH migraine, +FH PGF brain tumor   She has concern of GERD- She notes that her sx started about one week ago.  She thought that she might be constipated- she tried drinking coffee which made her sx worse.  She notes an epigastric pain, which she describes as sharp and gnawing.  This started about a week- this past Saturday she went to UC, was started on nexium and zantac. Her sx are a bit  She has never had this in the past Sx worse in the afternoon/ evening She does take some NSAIDs but not that frequently, mostly just for monthly menstrual cramps  No vomiting She has noted a bit of loose stool but no diarrhea She is worse after eating in general  She is not aware of any family history of reflux, ulcers  She did not have any labs done at her UC visit  She went to [facility]- they told her that her "immune system was struggling and that I re-activated my old mono infection." she has a large packet of labs that they drew for her  She is taking several supplements for her "EBV" infection.   She is using xanax about once a week for a panic attack- she is on 0.25 mg, go t#20 on 8/28 Her life is generally stable and happy, but she does admit that she may suffer from some low level  anxiety and perhaps mild dysthymia. She feels that she sleeps well, and her marriage is good. She has been trying to find out why she feels tired which is why she went to [facility]  LMP 11/5 She is on OCP Reviewed [place]- all ok Patient Active Problem List   Diagnosis Date Noted  . Great toe pain, left 11/03/2016    Past Medical History:  Diagnosis Date  . Headache, migraine     Past Surgical History:  Procedure Laterality Date  . wisdom teeth      Social History   Tobacco Use  . Smoking status: Never Smoker  . Smokeless tobacco: Never Used  Substance Use Topics  . Alcohol use: Yes    Alcohol/week: 0.5 oz    Types: 1 Standard drinks or equivalent per week  . Drug use: No     Family History  Problem Relation Age of Onset  . Migraines Mother     Allergies  Allergen Reactions  . Sulfa Drugs Cross Reactors Rash    Medication list has been reviewed and updated.  Current Outpatient Medications on File Prior to Visit  Medication Sig Dispense Refill  . ACETAMINOPHEN-BUTALBITAL 50-325 MG TABS TAKE ONE TABLET BY MOUTH EVERY 4 (FOUR) HOURS AS NEEDED.    Marland Kitchen desogestrel-ethinyl estradiol (KARIVA,AZURETTE,MIRCETTE) 0.15-0.02/0.01 MG (21/5) tablet Take 1 tablet by mouth daily.    . Desogestrel-Ethinyl Estradiol (VIORELE PO) Take 1 tablet by mouth daily.    . diclofenac (VOLTAREN) 75 MG EC tablet Take 1 tablet (75 mg total) by mouth 2 (two) times daily. 50 tablet 2  . MAGNESIUM PO Take by mouth.     No current facility-administered medications on file prior to visit.     Review of Systems:  As per HPI- otherwise negative. She has always been pretty slim- she is not trying to lose weight She is working 30- 35 hours a week.    Her job is not rewarding at all- she is not getting a lot of satisfaction from her current job She is married-no children as of yet.  marriage is good  She has been pretty busy with work and her home- she does not feel like she has a lot of extra energy   Physical Examination: Vitals:   05/04/17 0829  BP: 110/78  Pulse: 99  Temp: 97.6 F (36.4 C)  SpO2: 99%   Vitals:   05/04/17 0829  Weight: 134 lb 3.2 oz (60.9 kg)  Height: 5' 7.5" (1.715 m)   Body mass index is 20.71 kg/m. Ideal Body Weight: Weight in (lb) to have BMI = 25: 161.7  GEN: WDWN, NAD, Non-toxic, A & O x 3, slim build, looks well HEENT: Atraumatic, Normocephalic. Neck supple. No masses, No LAD.  Bilateral TM wnl, oropharynx normal.  PEERL,EOMI.   Ears and Nose: No external deformity. CV: RRR, No M/G/R. No JVD. No thrill. No extra heart sounds. PULM: CTA B, no wheezes, crackles, rhonchi. No retractions. No resp. distress. No accessory muscle use. ABD: S, NT, ND,  +BS. No rebound. No HSM. EXTR: No c/c/e NEURO Normal gait.  PSYCH: Normally interactive. Conversant. Not depressed or anxious appearing.  Calm demeanor.    Assessment and Plan: Epigastric pain - Plan: US Abdomen Limited RUQ, sucralfate (CARAFATE) 1 g tablet, Ambulatory referral to Gastroenterology  RUQ pain - Plan: US Abdomen Limited RUQ, sucralfate (CARAFATE) 1 g tablet  GAD (generalized anxiety disorder) - Plan: ALPRAZolam (XANAX) 0.25 MG tablet  Panic attack - Plan: ALPRAZolam (XANAX) 0.25 MG  tablet  Here today to establish care She has concersn about anxiety, fatigue, and also heartburn/ epigastric pain  She is taking a PPI and H2 blocker with some relief of her GI symptoms however certainly not total  Will add carafate and order RUQ [place]- if normal plan to refer to GI She takes occasional xanax- refilled for her today Explored the idea of an ssri or similar with her today- she will think about this   Signed Lamar Blinks, MD   Received her [place]  US Abdomen Limited Ruq  Result Date: 05/04/2017 CLINICAL DATA:  Epigastric pain EXAM: ULTRASOUND ABDOMEN LIMITED RIGHT UPPER QUADRANT COMPARISON:  None. FINDINGS: Gallbladder: No gallstones or wall thickening visualized. No sonographic Murphy sign noted by sonographer. Common bile duct: Diameter: 2 mm Liver: No focal lesion identified. Within normal limits in parenchymal echogenicity. Portal vein is patent on color Doppler imaging with normal direction of blood flow towards the liver. IMPRESSION: Normal right upper quadrant ultrasound. Electronically Signed   By: Monte Fantasia M.D.   On: 05/04/2017 09:50

## 2017-05-04 ENCOUNTER — Ambulatory Visit (HOSPITAL_BASED_OUTPATIENT_CLINIC_OR_DEPARTMENT_OTHER)
Admission: RE | Admit: 2017-05-04 | Discharge: 2017-05-04 | Disposition: A | Discharge status: Home / Self Care | Payer: BLUE CROSS/BLUE SHIELD | Source: Ambulatory Visit | Attending: Family Medicine | Admitting: Family Medicine

## 2017-05-04 ENCOUNTER — Encounter: Payer: Self-pay | Admitting: Family Medicine

## 2017-05-04 ENCOUNTER — Ambulatory Visit: Payer: BLUE CROSS/BLUE SHIELD | Admitting: Family Medicine

## 2017-05-04 VITALS — BP 110/78 | HR 99 | Temp 97.6°F | Ht 67.5 in | Wt 134.2 lb

## 2017-05-04 DIAGNOSIS — F411 Generalized anxiety disorder: Secondary | ICD-10-CM | POA: Diagnosis not present

## 2017-05-04 DIAGNOSIS — R1011 Right upper quadrant pain: Secondary | ICD-10-CM | POA: Diagnosis not present

## 2017-05-04 DIAGNOSIS — R1013 Epigastric pain: Secondary | ICD-10-CM | POA: Diagnosis not present

## 2017-05-04 DIAGNOSIS — F410 Panic disorder [episodic paroxysmal anxiety] without agoraphobia: Secondary | ICD-10-CM | POA: Diagnosis not present

## 2017-05-04 MED ORDER — SUCRALFATE 1 G PO TABS: 1.0000 g | ORAL_TABLET | Freq: Three times a day (TID) | ORAL | 0 refills | Status: DC

## 2017-05-04 MED ORDER — ALPRAZOLAM 0.25 MG PO TABS
0.2500 mg | ORAL_TABLET | Freq: Two times a day (BID) | ORAL | 0 refills | Status: DC | PRN
Start: 2017-05-04 — End: 2017-07-20

## 2017-05-04 MED FILL — ALPRAZolam 0.25 MG TABS: 0.25 | 15 days supply | Qty: 30 | Fill #0

## 2017-05-04 NOTE — Patient Instructions (Addendum)
Please go to the ground floor imaging dept for your ultrasound after we finish up here today- then you can go home, and I will be in touch with your results asap I gave you an rx for carafate to take up to 4x a day- with meals and at bedtime- for the next 7- 10 days.    Please think about what you would like to do as far as anxiety- we might try a different type of medication for you if you are up for it.  We will touch base soon

## 2017-05-05 ENCOUNTER — Encounter: Payer: Self-pay | Admitting: Family Medicine

## 2017-05-05 ENCOUNTER — Encounter: Payer: Self-pay | Admitting: Physician Assistant

## 2017-05-10 ENCOUNTER — Other Ambulatory Visit: Payer: Self-pay | Admitting: Emergency Medicine

## 2017-05-10 ENCOUNTER — Encounter: Payer: Self-pay | Admitting: Emergency Medicine

## 2017-05-18 ENCOUNTER — Other Ambulatory Visit: Payer: Self-pay | Admitting: Emergency Medicine

## 2017-05-18 DIAGNOSIS — K219 Gastro-esophageal reflux disease without esophagitis: Secondary | ICD-10-CM

## 2017-05-19 ENCOUNTER — Other Ambulatory Visit: Payer: BLUE CROSS/BLUE SHIELD

## 2017-05-19 DIAGNOSIS — K219 Gastro-esophageal reflux disease without esophagitis: Secondary | ICD-10-CM

## 2017-05-22 LAB — H. PYLORI BREATH TEST: H. PYLORI BREATH TEST: NOT DETECTED

## 2017-05-23 ENCOUNTER — Ambulatory Visit: Payer: BLUE CROSS/BLUE SHIELD | Admitting: Physician Assistant

## 2017-05-23 ENCOUNTER — Other Ambulatory Visit (INDEPENDENT_AMBULATORY_CARE_PROVIDER_SITE_OTHER): Payer: BLUE CROSS/BLUE SHIELD

## 2017-05-23 ENCOUNTER — Encounter: Payer: Self-pay | Admitting: Physician Assistant

## 2017-05-23 VITALS — BP 90/60 | HR 72 | Ht 66.75 in | Wt 130.4 lb

## 2017-05-23 DIAGNOSIS — K2990 Gastroduodenitis, unspecified, without bleeding: Secondary | ICD-10-CM | POA: Diagnosis not present

## 2017-05-23 DIAGNOSIS — K2970 Gastritis, unspecified, without bleeding: Secondary | ICD-10-CM

## 2017-05-23 DIAGNOSIS — R1013 Epigastric pain: Secondary | ICD-10-CM | POA: Diagnosis not present

## 2017-05-23 LAB — CBC WITH DIFFERENTIAL/PLATELET
BASOS PCT: 0.7 % (ref 0.0–3.0)
Basophils Absolute: 0 10*3/uL (ref 0.0–0.1)
EOS ABS: 0.1 10*3/uL (ref 0.0–0.7)
EOS PCT: 2.8 % (ref 0.0–5.0)
HCT: 43.5 % (ref 36.0–46.0)
HEMOGLOBIN: 14.8 g/dL (ref 12.0–15.0)
LYMPHS ABS: 1.9 10*3/uL (ref 0.7–4.0)
Lymphocytes Relative: 36 % (ref 12.0–46.0)
MCHC: 34 g/dL (ref 30.0–36.0)
MCV: 90.9 fl (ref 78.0–100.0)
MONO ABS: 0.5 10*3/uL (ref 0.1–1.0)
Monocytes Relative: 9.2 % (ref 3.0–12.0)
NEUTROS ABS: 2.6 10*3/uL (ref 1.4–7.7)
NEUTROS PCT: 51.3 % (ref 43.0–77.0)
PLATELETS: 214 10*3/uL (ref 150.0–400.0)
RBC: 4.78 Mil/uL (ref 3.87–5.11)
RDW: 13.3 % (ref 11.5–15.5)
WBC: 5.2 10*3/uL (ref 4.0–10.5)

## 2017-05-23 LAB — COMPREHENSIVE METABOLIC PANEL
ALBUMIN: 4.9 g/dL (ref 3.5–5.2)
ALT: 19 U/L (ref 0–35)
AST: 18 U/L (ref 0–37)
Alkaline Phosphatase: 31 U/L — ABNORMAL LOW (ref 39–117)
BUN: 13 mg/dL (ref 6–23)
CHLORIDE: 103 meq/L (ref 96–112)
CO2: 30 mEq/L (ref 19–32)
CREATININE: 0.87 mg/dL (ref 0.40–1.20)
Calcium: 9.7 mg/dL (ref 8.4–10.5)
GFR: 81.68 mL/min (ref >60.00)
GLUCOSE: 89 mg/dL (ref 70–99)
POTASSIUM: 4.8 meq/L (ref 3.5–5.1)
SODIUM: 140 meq/L (ref 135–145)
Total Bilirubin: 0.6 mg/dL (ref 0.2–1.2)
Total Protein: 7.6 g/dL (ref 6.0–8.3)

## 2017-05-23 LAB — LIPASE: LIPASE: 15 U/L (ref 11.0–59.0)

## 2017-05-23 LAB — SEDIMENTATION RATE: Sed Rate: 1 mm/hr (ref 0–20)

## 2017-05-23 NOTE — Progress Notes (Addendum)
Subjective:    Patient ID: Kimberly Blankenship, female    DOB: 1986/05/29, 31 y.o.   MRN: [id number]  HPI Kimberly Blankenship is a pleasant 31 year old white female, new to GI today referred by Silvestre Mesi MD for evaluation of epigastric pain.  Patient has history of neurolysed anxiety disorder, has otherwise been in good health. She says her current symptoms started about 3 weeks ago with what she describes as a gnawing and cramping type of pain in her epigastrium which seemed to progress throughout the day.  He said it got to the point where it was fairly constant.  There is no associated nausea or vomiting no fever or chills.  No diarrhea or melena.  Appetite has been okay and she cannot definitely correlate that symptoms are better or worse with eating.  She tried over-the-counter Nexium for a few days and Zantac for several days as well as Tums.  She felt all of these helped a little bit. She does not use any regular aspirin or NSAIDs but does take ibuprofen for at least a couple of days every month around her menstrual period. After being seen by Dr. Edilia Bo she was started on Pepcid 20 mg twice daily and says she actually is feeling better this week and that most of her symptoms have resolved.  She is concerned about what caused this. Upper abdominal ultrasound on 05/04/2017 showed no gallstones, common bile duct of 2 mm and liver within normal limits.  H. pylori breath testing was negative on 05/19/2017. She does not drink any regular alcohol, family history is negative for GI disease.  Review of Systems Pertinent positive and negative review of systems were noted in the above HPI section.  All other review of systems was otherwise negative.  Outpatient Encounter Medications as of 05/23/2017  Medication Sig  . ACETAMINOPHEN-BUTALBITAL 50-325 MG TABS TAKE ONE TABLET BY MOUTH EVERY 4 (FOUR) HOURS AS NEEDED.  Marland Kitchen ALPRAZolam (XANAX) 0.25 MG tablet Take 1 tablet (0.25 mg total) 2 (two) times daily as needed by  mouth for anxiety.  . Ascorbic Acid (VITAMIN C) 1000 MG tablet Take 1,000 mg by mouth daily.  . Cholecalciferol (VITAMIN D3) 5000 units CAPS Take 1 capsule by mouth daily.  . diclofenac (VOLTAREN) 75 MG EC tablet Take 1 tablet (75 mg total) by mouth 2 (two) times daily.  . Ergocalciferol (VITAMIN D2) 2000 units TABS Take 1 tablet by mouth daily.  Marland Kitchen MAGNESIUM PO Take by mouth.  . Menaquinone-7 (VITAMIN K2) 100 MCG CAPS Take 1 tablet by mouth daily.  . Selenium 200 MCG TABS Take 1 tablet by mouth daily.  . sucralfate (CARAFATE) 1 g tablet Take 1 tablet (1 g total) 4 (four) times daily -  with meals and at bedtime by mouth. Use as needed for GERD  . vitamin B-12 (CYANOCOBALAMIN) 1000 MCG tablet Take 1,000 mcg by mouth daily.  Marland Kitchen zinc gluconate 50 MG tablet Take 50 mg by mouth daily.  . [DISCONTINUED] desogestrel-ethinyl estradiol (KARIVA,AZURETTE,MIRCETTE) 0.15-0.02/0.01 MG (21/5) tablet Take 1 tablet by mouth daily.  . [DISCONTINUED] Desogestrel-Ethinyl Estradiol (VIORELE PO) Take 1 tablet by mouth daily.   No facility-administered encounter medications on file as of 05/23/2017.    Allergies  Allergen Reactions  . Sulfa Drugs Cross Reactors Rash   Patient Active Problem List   Diagnosis Date Noted  . Great toe pain, left 11/03/2016   Social History   Socioeconomic History  . Marital status: Married    Spouse name: Not on file  .  Number of children: 0  . Years of education: Not on file  . Highest education level: Not on file  Social Needs  . Financial resource strain: Not on file  . Food insecurity - worry: Not on file  . Food insecurity - inability: Not on file  . Transportation needs - medical: Not on file  . Transportation needs - non-medical: Not on file  Occupational History  . Occupation: admin assist  Tobacco Use  . Smoking status: Never Smoker  . Smokeless tobacco: Never Used  Substance and Sexual Activity  . Alcohol use: Yes    Alcohol/week: 0.5 oz    Types: 1  Standard drinks or equivalent per week    Comment: 1-2 once a week  . Drug use: No  . Sexual activity: Yes    Birth control/protection: Pill    Comment: viorelle  Other Topics Concern  . Not on file  Social History Narrative  . Not on file    Ms. Well's family history includes Arthritis in her maternal grandmother, mother, and paternal grandmother; COPD in her paternal grandmother; Depression in her brother; Diabetes in her mother; Hypertension in her maternal grandfather; Miscarriages / [place] in her mother; Other in her paternal grandfather; Stomach cancer in her maternal grandfather; Uterine cancer in her paternal aunt.      Objective:    Vitals:   05/23/17 0830  BP: 90/60  Pulse: 72    Physical Exam ; well-developed young white female in no acute distress, pleasant blood pressure 90/60, pulse 72, height 5 foot 6, weight 130, BMI 20.5.  HEENT ;nontraumatic normocephalic EOMI PERRLA sclera anicteric, Cardiovascular; regular rate and rhythm with S1-S2 no murmur rub or gallop, Pulmonary ;clear bilaterally, Abdomen ;soft there is some very minimal epigastric tenderness no guarding or rebound no palpable mass or hepatosplenomegaly bowel sounds are present, Rectal; exam not done, Neuro psych; mood and affect appropriate       Assessment & Plan:   #61 31 year old white female with 3-week history of epigastric gnawing and cramping type pain improved after starting Pepcid twice daily over the past week and a half. Upper abdominal ultrasound was unremarkable and H. pylori breath testing negative. I suspect she had an acute gastritis which is now resolving. This may have been precipitated by NSAIDs which she uses periodically.  Plan; Reassurance Continue Pepcid or Zantac twice daily for another 1-2 weeks and then use as needed. Also suggested she use an H2 blocker for a few days around her menstrual cycle when she is taking NSAIDs regularly. CBC with differential, CMETand lipase  today Happy to see her back in follow-up if she has any recurrence.  I do not think endoscopy is indicated at this time as it was unlikely to change our management.  Amy S Esterwood PA-C 05/23/2017   Cc: Copland, Gay Filler, MD  Addendum: Reviewed and agree with initial management. Pyrtle, Lajuan Lines, MD

## 2017-05-23 NOTE — Patient Instructions (Signed)
Please go to the basement level to have your labs drawn.   Take over the counter Zantac 150 mg twice daily as needed for recurrent epigastric pain.   Follow up with Nicoletta Ba PA as needed.

## 2017-07-20 ENCOUNTER — Other Ambulatory Visit: Payer: Self-pay | Admitting: Family Medicine

## 2017-07-20 DIAGNOSIS — F411 Generalized anxiety disorder: Secondary | ICD-10-CM

## 2017-07-20 DIAGNOSIS — F410 Panic disorder [episodic paroxysmal anxiety] without agoraphobia: Secondary | ICD-10-CM

## 2017-07-21 ENCOUNTER — Encounter: Payer: Self-pay | Admitting: Family Medicine

## 2017-07-21 MED ORDER — ALPRAZOLAM 0.25 MG PO TABS: 0.2500 mg | ORAL_TABLET | Freq: Two times a day (BID) | ORAL | 0 refills | Status: DC | PRN

## 2017-07-21 NOTE — Telephone Encounter (Signed)
Requesting: alprazolam   Contract: No signed contract  UDS: no screening  Last Visit: 05/04/17 Next Visit: No upcoming visit Last Refill: 05/04/17  Please Advise

## 2017-07-21 NOTE — Telephone Encounter (Signed)
Pt is requesting refill on alprazolam.

## 2017-08-14 ENCOUNTER — Encounter: Payer: Self-pay | Admitting: Family Medicine

## 2017-08-19 NOTE — Progress Notes (Signed)
[place] at [facility] [address], [place], [place] [address] [phone]  Date:  08/23/2017   Name:  Kimberly Blankenship   DOB:  Feb 11, 1986   MRN:  [id number]  PCP:  Darreld Mclean, MD    Chief Complaint: Follow-up (Pt would like to discuss medication for anxiety and depression. )   History of Present Illness:  Kimberly Blankenship is a 32 y.o. very pleasant female patient who presents with the following:  I last saw her in November at which time she was taking prn xanax. We had discussed an SSRi, but she was not ready to try one at our last visit She has been trying to treat this with diet/ exercise, but is now at the point of wanting to try an SSRi or similar  She was on 10 mg of celexa in the past, but it did not seem to help- this was in 2014/ 15 They then changed her to zoloft- it worked but she did not want to continue taking it long term due due to low libido.  She took this for a couple of years  She continues to get some anxiety attacks Her depression manifests more as low motivation. She often feels like she does not want to do anything.   Not really having crying spells She does feel emotionally stressed.   Appetite is ok She seems to be sleeping ok- she does not always feel rested, but does not think she is sleeping too much   No SI or HI   She is using xanax maybe once a week for panic attacks- these have gotten better  Patient Active Problem List   Diagnosis Date Noted  . Great toe pain, left 11/03/2016    Past Medical History:  Diagnosis Date  . Anxiety   . Chicken pox   . Depression   . GERD (gastroesophageal reflux disease)   . Hay fever   . Headache, migraine     Past Surgical History:  Procedure Laterality Date  . wisdom teeth      Social History   Tobacco Use  . Smoking status: Never Smoker  . Smokeless tobacco: Never Used  Substance Use Topics  . Alcohol use: Yes    Alcohol/week: 0.5 oz   Types: 1 Standard drinks or equivalent per week    Comment: 1-2 once a week  . Drug use: No    Family History  Problem Relation Age of Onset  . Arthritis Mother   . Diabetes Mother   . Miscarriages / [place] Mother   . Depression Brother   . Arthritis Maternal Grandmother   . Hypertension Maternal Grandfather   . Stomach cancer Maternal Grandfather   . Arthritis Paternal Grandmother   . COPD Paternal Grandmother   . Other Paternal Grandfather        Brain tumor  . Uterine cancer Paternal Aunt     Allergies  Allergen Reactions  . Sulfa Drugs Cross Reactors Rash    Medication list has been reviewed and updated.  Current Outpatient Medications on File Prior to Visit  Medication Sig Dispense Refill  . ACETAMINOPHEN-BUTALBITAL 50-325 MG TABS TAKE ONE TABLET BY MOUTH EVERY 4 (FOUR) HOURS AS NEEDED.    Marland Kitchen ALPRAZolam (XANAX) 0.25 MG tablet Take 1 tablet (0.25 mg total) by mouth 2 (two) times daily as needed for anxiety. 30 tablet 0  . Ascorbic Acid (VITAMIN C) 1000 MG tablet Take 1,000 mg by mouth  daily.    . Cholecalciferol (VITAMIN D3) 5000 units CAPS Take 1 capsule by mouth daily.    . Ergocalciferol (VITAMIN D2) 2000 units TABS Take 1 tablet by mouth daily.    Marland Kitchen MAGNESIUM PO Take by mouth.    . Menaquinone-7 (VITAMIN K2) 100 MCG CAPS Take 1 tablet by mouth daily.    . Selenium 200 MCG TABS Take 1 tablet by mouth daily.    . sucralfate (CARAFATE) 1 g tablet Take 1 tablet (1 g total) 4 (four) times daily -  with meals and at bedtime by mouth. Use as needed for GERD 40 tablet 0  . vitamin B-12 (CYANOCOBALAMIN) 1000 MCG tablet Take 1,000 mcg by mouth daily.    Marland Kitchen zinc gluconate 50 MG tablet Take 50 mg by mouth daily.     No current facility-administered medications on file prior to visit.     Review of Systems:  As per HPI- otherwise negative.   Physical Examination: Vitals:   08/23/17 1201  BP: 102/78  Pulse: 84  Temp: 97.7 F (36.5 C)  SpO2: 98%   Vitals:    08/23/17 1201  Weight: 135 lb 12.8 oz (61.6 kg)  Height: 5\' 7"  (1.702 m)   Body mass index is 21.27 kg/m. Ideal Body Weight: Weight in (lb) to have BMI = 25: 159.3  GEN: WDWN, NAD, Non-toxic, A & O x 3, slender build, looks well  HEENT: Atraumatic, Normocephalic. Neck supple. No masses, No LAD.   Ears and Nose: No external deformity. CV: RRR, No M/G/R. No JVD. No thrill. No extra heart sounds. PULM: CTA B, no wheezes, crackles, rhonchi. No retractions. No resp. distress. No accessory muscle use.Marland Kitchen EXTR: No c/c/e NEURO Normal gait.  PSYCH: Normally interactive. Conversant. Not depressed or anxious appearing.  Calm demeanor.    Assessment and Plan: GAD (generalized anxiety disorder) - Plan: FLUoxetine (PROZAC) 20 MG tablet, DISCONTINUED: FLUoxetine (PROZAC) 20 MG tablet  Dysthymia - Plan: FLUoxetine (PROZAC) 20 MG tablet, DISCONTINUED: FLUoxetine (PROZAC) 20 MG tablet  Will start her on prozac today- 20 mg, increase to 40 mg after 2-3 weeks She will update me via mychart in a month or so- sooner if not doing ok! She is using her xanax just occasionally, she does not need a refill as of yet   Signed Lamar Blinks, MD

## 2017-08-23 ENCOUNTER — Ambulatory Visit: Payer: No Typology Code available for payment source | Admitting: Family Medicine

## 2017-08-23 ENCOUNTER — Encounter: Payer: Self-pay | Admitting: Family Medicine

## 2017-08-23 VITALS — BP 102/78 | HR 84 | Temp 97.7°F | Ht 67.0 in | Wt 135.8 lb

## 2017-08-23 DIAGNOSIS — F341 Dysthymic disorder: Secondary | ICD-10-CM

## 2017-08-23 DIAGNOSIS — F411 Generalized anxiety disorder: Secondary | ICD-10-CM

## 2017-08-23 MED ORDER — FLUOXETINE HCL 20 MG PO TABS: 20.0000 mg | ORAL_TABLET | Freq: Every day | ORAL | 5 refills | Status: DC

## 2017-08-23 MED FILL — FLUoxetine HCL 20 MG TABS: 20 | 34 days supply | Qty: 60 | Fill #0

## 2017-08-23 NOTE — Patient Instructions (Signed)
Good to see you again today!   Please start on 20 mg of prozac once a day- you can increase to 2 pills after 2-3 weeks if you like. Please let me know how this works for you over the next month or so- you can message me.  Please alert me sooner if you are not doing ok or if symptoms are worse

## 2017-08-25 ENCOUNTER — Encounter: Payer: Self-pay | Admitting: Family Medicine

## 2017-08-25 DIAGNOSIS — F410 Panic disorder [episodic paroxysmal anxiety] without agoraphobia: Secondary | ICD-10-CM

## 2017-08-25 DIAGNOSIS — F411 Generalized anxiety disorder: Secondary | ICD-10-CM

## 2017-08-29 MED ORDER — ALPRAZOLAM 0.25 MG PO TABS: 0.2500 mg | ORAL_TABLET | Freq: Two times a day (BID) | ORAL | 0 refills | Status: DC | PRN

## 2017-08-29 MED FILL — ALPRAZolam 0.25 MG TABS: 0.25 | 15 days supply | Qty: 30 | Fill #0

## 2017-08-29 NOTE — Addendum Note (Signed)
Addended by: Lamar Blinks C on: 08/29/2017 12:47 PM   Modules accepted: Orders

## 2017-08-30 ENCOUNTER — Ambulatory Visit: Payer: Self-pay | Admitting: Family Medicine

## 2017-09-21 ENCOUNTER — Ambulatory Visit: Payer: No Typology Code available for payment source | Admitting: Family Medicine

## 2017-09-21 ENCOUNTER — Encounter: Payer: Self-pay | Admitting: Family Medicine

## 2017-09-21 VITALS — BP 118/76 | HR 69 | Temp 98.0°F | Ht 67.0 in | Wt 131.4 lb

## 2017-09-21 DIAGNOSIS — J0100 Acute maxillary sinusitis, unspecified: Secondary | ICD-10-CM | POA: Diagnosis not present

## 2017-09-21 MED ORDER — AMOXICILLIN-POT CLAVULANATE 875-125 MG PO TABS: 1.0000 | ORAL_TABLET | Freq: Two times a day (BID) | ORAL | 0 refills | Status: DC

## 2017-09-21 MED ORDER — LEVOCETIRIZINE DIHYDROCHLORIDE 5 MG PO TABS: 5.0000 mg | ORAL_TABLET | Freq: Every evening | ORAL | 2 refills | Status: DC

## 2017-09-21 MED ORDER — FLUTICASONE PROPIONATE 50 MCG/ACT NA SUSP: 2.0000 | Freq: Every day | NASAL | 2 refills | Status: DC

## 2017-09-21 NOTE — Progress Notes (Signed)
Chief Complaint  Patient presents with  . Sinus Problem    Kimberly Blankenship here for URI complaints.  Duration: 6 days  Associated symptoms: sinus congestion, sinus pain, rhinorrhea and dental pain Denies: itchy watery eyes, ear fullness, ear pain, ear drainage, sore throat, wheezing, shortness of breath, myalgia and fevers Treatment to date: Saline rinse, Sudafed Sick contacts: No  ROS:  Const: Denies fevers HEENT: As noted in HPI Lungs: No SOB  Past Medical History:  Diagnosis Date  . Anxiety   . Chicken pox   . Depression   . GERD (gastroesophageal reflux disease)   . Hay fever   . Headache, migraine    Family History  Problem Relation Age of Onset  . Arthritis Mother   . Diabetes Mother   . Miscarriages / [place] Mother   . Depression Brother   . Arthritis Maternal Grandmother   . Hypertension Maternal Grandfather   . Stomach cancer Maternal Grandfather   . Arthritis Paternal Grandmother   . COPD Paternal Grandmother   . Other Paternal Grandfather        Brain tumor  . Uterine cancer Paternal Aunt     BP 118/76 (BP Location: Left Arm, Patient Position: Sitting, Cuff Size: Normal)   Pulse 69   Temp 98 F (36.7 C) (Oral)   Ht 5\' 7"  (1.702 m)   Wt 131 lb 6 oz (59.6 kg)   SpO2 99%   BMI 20.58 kg/m  General: Awake, alert, appears stated age HEENT: AT, [place], ears patent b/l and TM's neg, max sinuses ttp b/l; nares patent w/o discharge, pharynx pink and without exudates, MMM Neck: No masses or asymmetry Heart: RRR Lungs: CTAB, no accessory muscle use Psych: Age appropriate judgment and insight, normal mood and affect  Acute maxillary sinusitis, recurrence not specified - Plan: fluticasone (FLONASE) 50 MCG/ACT nasal spray, levocetirizine (XYZAL) 5 MG tablet, amoxicillin-clavulanate (AUGMENTIN) 875-125 MG tablet  Orders as above. Cover allergies, supportive care, pocket rx given. F/u prn. Pt voiced understanding and agreement to the plan.  [place], DO 09/21/17 11:26 AM

## 2017-09-21 NOTE — Patient Instructions (Addendum)
Most sinus infections are viral in etiology and antibiotics will not be helpful. That being said, if you start having worsening symptoms over 3 days, you are worsening by day 10 or not improving by day 14, go ahead and take it. You are on Day 6 as of now.   Claritin (loratadine), Allegra (fexofenadine), Zyrtec (cetirizine); these are listed in order from weakest to strongest. Generic, and therefore cheaper, options are in the parentheses.   Flonase (fluticasone); nasal spray that is over the counter. 2 sprays each nostril, once daily. Aim towards the same side eye when you spray.  There are available OTC, and the generic versions, which may be cheaper, are in parentheses. Show this to a pharmacist if you have trouble finding any of these items.  Let us know if you need anything.

## 2017-09-21 NOTE — Progress Notes (Signed)
Pre visit review using our clinic review tool, if applicable. No additional management support is needed unless otherwise documented below in the visit note. 

## 2017-09-27 ENCOUNTER — Encounter: Payer: Self-pay | Admitting: Family Medicine

## 2017-10-09 NOTE — Progress Notes (Addendum)
[place] at [facility] [address] [place], [place] [address] [phone]  Date:  10/11/2017   Name:  Kimberly Blankenship   DOB:  Aug 19, 1985   MRN:  [id number]  PCP:  Darreld Mclean, MD    Chief Complaint: Menstrual Problem (c/o painful periods clear discharge and spotting between periods. Pt states sx's have been present for a couple of months. )   History of Present Illness:  Kimberly Blankenship is a 32 y.o. very pleasant female patient who presents with the following:  Generally healthy young woman here today with concern of spotting in the week prior to her menses and more cramping/ pain with her menses.  The painful menses are not new, but got better for a while and then started to get worse again over the last couple of years. She will notice some vaginal discharge during the month that is clear and sometimes smells like onions.  She does not have any itching or pain.  She also notes that her hair- like on her legs- is growing quickly and wonders if her testosterone is high  Her menses come every 26 days or so. She may bleed for about one week which is normal for her.  Menarche at age 31,she had a lot of pain and cramping in her teens but this got better for a while at the age of 44. She notes that she "passed out" for the pain of her menses a couple of times as a teen She is not planning a pregnancy any time very soon, but also does not want to be on a long term contraceptive at thsi time She had tried the nuva ring, implanon, 2 different types of pills- however these did not seem to reduce her menstrual symtoms.  She does not wish to go back on contraception really  She has never been to see GYN about her concerns.    Her last pap was sometime in 2016 she thinks, never had an abnormal   Her LMP was 09/28/17.   Last seen by myself in March: I last saw her in November at which time she was taking prn xanax. We had discussed an SSRi,  but she was not ready to try one at our last visit She has been trying to treat this with diet/ exercise, but is now at the point of wanting to try an SSRi or similar  She was on 10 mg of celexa in the past, but it did not seem to help- this was in 2014/ 15 They then changed her to zoloft- it worked but she did not want to continue taking it long term due due to low libido.  She took this for a couple of years  She continues to get some anxiety attacks Her depression manifests more as low motivation. She often feels like she does not want to do anything.   Not really having crying spells She does feel emotionally stressed.   Appetite is ok She seems to be sleeping ok- she does not always feel rested, but does not think she is sleeping too much   We gave her some prozac to try at last visit; however she did not like it - seemed to cause bloating- and stopped using it after a few weeks  Her stomach issues cleared up with her stopping the prozac. She is not sure if this might be coincidence and plans to maybe try the prozac again as she  continues to have anxiety   She is married, husband Shanon Brow    Patient Active Problem List   Diagnosis Date Noted  . Great toe pain, left 11/03/2016    Past Medical History:  Diagnosis Date  . Anxiety   . Chicken pox   . Depression   . GERD (gastroesophageal reflux disease)   . Hay fever   . Headache, migraine     Past Surgical History:  Procedure Laterality Date  . wisdom teeth      Social History   Tobacco Use  . Smoking status: Never Smoker  . Smokeless tobacco: Never Used  Substance Use Topics  . Alcohol use: Yes    Alcohol/week: 0.5 oz    Types: 1 Standard drinks or equivalent per week    Comment: 1-2 once a week  . Drug use: No    Family History  Problem Relation Age of Onset  . Arthritis Mother   . Diabetes Mother   . Miscarriages / [place] Mother   . Depression Brother   . Arthritis Maternal Grandmother   . Hypertension  Maternal Grandfather   . Stomach cancer Maternal Grandfather   . Arthritis Paternal Grandmother   . COPD Paternal Grandmother   . Other Paternal Grandfather        Brain tumor  . Uterine cancer Paternal Aunt     Allergies  Allergen Reactions  . Sulfa Drugs Cross Reactors Rash    Medication list has been reviewed and updated.  Current Outpatient Medications on File Prior to Visit  Medication Sig Dispense Refill  . ACETAMINOPHEN-BUTALBITAL 50-325 MG TABS TAKE ONE TABLET BY MOUTH EVERY 4 (FOUR) HOURS AS NEEDED.    Marland Kitchen ALPRAZolam (XANAX) 0.25 MG tablet Take 1 tablet (0.25 mg total) by mouth 2 (two) times daily as needed for anxiety. 30 tablet 0  . Ascorbic Acid (VITAMIN C) 1000 MG tablet Take 1,000 mg by mouth daily.    . Cholecalciferol (VITAMIN D3) 5000 units CAPS Take 1 capsule by mouth daily.    . Ergocalciferol (VITAMIN D2) 2000 units TABS Take 1 tablet by mouth daily.    Marland Kitchen FLUoxetine (PROZAC) 20 MG tablet Take 1 tablet (20 mg total) by mouth daily. Increase to 2 pills after 2-3 weeks 60 tablet 5  . fluticasone (FLONASE) 50 MCG/ACT nasal spray Place 2 sprays into both nostrils daily. 16 g 2  . levocetirizine (XYZAL) 5 MG tablet Take 1 tablet (5 mg total) by mouth every evening. 30 tablet 2  . MAGNESIUM PO Take by mouth.    . Menaquinone-7 (VITAMIN K2) 100 MCG CAPS Take 1 tablet by mouth daily.    . Selenium 200 MCG TABS Take 1 tablet by mouth daily.    . vitamin B-12 (CYANOCOBALAMIN) 1000 MCG tablet Take 1,000 mcg by mouth daily.    Marland Kitchen zinc gluconate 50 MG tablet Take 50 mg by mouth daily.     No current facility-administered medications on file prior to visit.     Review of Systems:  As per HPI- otherwise negative.  No fever or chills   Physical Examination: Vitals:   10/11/17 0831  BP: (!) 103/52  Pulse: 96  Temp: 97.9 F (36.6 C)  SpO2: 97%   Vitals:   10/11/17 0831  Weight: 132 lb (59.9 kg)  Height: 5\' 7"  (1.702 m)   Body mass index is 20.67 kg/m. Ideal  Body Weight: Weight in (lb) to have BMI = 25: 159.3  GEN: WDWN, NAD, Non-toxic, A & O x  3, thin build, looks well  HEENT: Atraumatic, Normocephalic. Neck supple. No masses, No LAD.  Bilateral TM wnl, oropharynx normal.  PEERL,EOMI.   Ears and Nose: No external deformity. CV: RRR, No M/G/R. No JVD. No thrill. No extra heart sounds. PULM: CTA B, no wheezes, crackles, rhonchi. No retractions. No resp. distress. No accessory muscle use. ABD: S, NT, ND, +BS. No rebound. No HSM. Mobile structure noted in her lower abdomen- likely her uterus which is more palpable as she is so slim EXTR: No c/c/e NEURO Normal gait.  PSYCH: Normally interactive. Conversant. Not depressed or anxious appearing.  Calm demeanor.  Pelvic: normal, no vaginal lesions or discharge. Uterus normal, no CMT, no adnexal tendereness or masses   Results for orders placed or performed in visit on 10/11/17  POCT urine pregnancy  Result Value Ref Range   Preg Test, Ur Negative Negative     Assessment and Plan: Menses painful - Plan: POCT urine pregnancy, Ambulatory referral to Obstetrics / Gynecology  Screening for cervical cancer - Plan: Cytology - PAP  Abdominal fullness - Plan: US Abdomen Complete  Here today with a couple of concerns. She has painful menses with cramping, which has failed to respond to nuva ring and OCP in the past.  ?fibroids or endometriosis.  Referral to OBG for further evaluation Pap pending today, reassured her that she likely has physiologic vaginal discharge but will test for any other cause on her pap today as well Presume that GYN will likely do a pelvic US for her. Will obtain abd [place] as well to further investigate structure noted on exam as above  See patient instructions for more details.     Signed Lamar Blinks, MD  4/27 Received her pap, message to patient  Adequacy Satisfactory for evaluation endocervical/transformation zone component ABSENT.   Diagnosis NEGATIVE FOR  INTRAEPITHELIAL LESIONS OR MALIGNANCY.   Bacterial vaginitis Negative for Bacterial Vaginitis Microorganisms   Comment: Normal Reference Range - Negative  Candida vaginitis Negative for Candida species   Comment: Normal Reference Range - Negative  Trichomonas Negative   Comment: Normal Reference Range - Negative  Chlamydia Negative   Comment: Normal Reference Range - Negative  Neisseria gonorrhea Negative   Comment: Normal Reference Range - Negative

## 2017-10-11 ENCOUNTER — Ambulatory Visit: Payer: 59 | Admitting: Family Medicine

## 2017-10-11 ENCOUNTER — Encounter: Payer: Self-pay | Admitting: Family Medicine

## 2017-10-11 ENCOUNTER — Other Ambulatory Visit (HOSPITAL_COMMUNITY)
Admission: RE | Admit: 2017-10-11 | Discharge: 2017-10-11 | Disposition: A | Discharge status: Home / Self Care | Payer: 59 | Source: Ambulatory Visit | Attending: Family Medicine | Admitting: Family Medicine

## 2017-10-11 VITALS — BP 110/70 | HR 96 | Temp 97.9°F | Ht 67.0 in | Wt 132.0 lb

## 2017-10-11 DIAGNOSIS — Z124 Encounter for screening for malignant neoplasm of cervix: Secondary | ICD-10-CM

## 2017-10-11 DIAGNOSIS — N946 Dysmenorrhea, unspecified: Secondary | ICD-10-CM | POA: Diagnosis not present

## 2017-10-11 DIAGNOSIS — R198 Other specified symptoms and signs involving the digestive system and abdomen: Secondary | ICD-10-CM

## 2017-10-11 LAB — POCT URINE PREGNANCY: Preg Test, Ur: NEGATIVE

## 2017-10-11 NOTE — Patient Instructions (Signed)
Good to see you today- I will be in touch with your pap asap, and we will get you scheduled to see OB-GYN. They will likely do a pelvic US for you, and I am going to set up an abdominal ultrasound to examine the rest of your belly.   Please let me know if any concerns in the meantime or if you don't hear about either of your appointments

## 2017-10-12 ENCOUNTER — Encounter: Payer: Self-pay | Admitting: Family Medicine

## 2017-10-13 LAB — CYTOLOGY - PAP
Adequacy: ABSENT
Bacterial vaginitis: NEGATIVE
CANDIDA VAGINITIS: NEGATIVE
Chlamydia: NEGATIVE
Diagnosis: NEGATIVE
NEISSERIA GONORRHEA: NEGATIVE
TRICH (WINDOWPATH): NEGATIVE

## 2017-10-14 ENCOUNTER — Encounter: Payer: Self-pay | Admitting: Family Medicine

## 2017-10-18 MED ORDER — BUTALBITAL-ACETAMINOPHEN 50-325 MG PO TABS: ORAL_TABLET | ORAL | 0 refills | Status: DC

## 2017-10-18 NOTE — Addendum Note (Signed)
Addended by: Lamar Blinks C on: 10/18/2017 05:14 PM   Modules accepted: Orders

## 2017-10-19 ENCOUNTER — Encounter: Payer: Self-pay | Admitting: Family Medicine

## 2017-10-19 DIAGNOSIS — A6920 Lyme disease, unspecified: Secondary | ICD-10-CM | POA: Diagnosis not present

## 2017-10-19 DIAGNOSIS — E039 Hypothyroidism, unspecified: Secondary | ICD-10-CM | POA: Diagnosis not present

## 2017-11-01 ENCOUNTER — Ambulatory Visit: Payer: 59 | Admitting: Obstetrics & Gynecology

## 2017-11-01 ENCOUNTER — Other Ambulatory Visit (HOSPITAL_COMMUNITY)
Admission: RE | Admit: 2017-11-01 | Discharge: 2017-11-01 | Disposition: A | Discharge status: Home / Self Care | Payer: 59 | Source: Ambulatory Visit | Attending: Obstetrics & Gynecology | Admitting: Obstetrics & Gynecology

## 2017-11-01 ENCOUNTER — Encounter: Payer: Self-pay | Admitting: Obstetrics & Gynecology

## 2017-11-01 VITALS — BP 119/76 | HR 72 | Ht 67.0 in | Wt 133.0 lb

## 2017-11-01 DIAGNOSIS — Z113 Encounter for screening for infections with a predominantly sexual mode of transmission: Secondary | ICD-10-CM | POA: Diagnosis not present

## 2017-11-01 DIAGNOSIS — N921 Excessive and frequent menstruation with irregular cycle: Secondary | ICD-10-CM | POA: Diagnosis not present

## 2017-11-01 NOTE — Patient Instructions (Signed)
Metrorrhagia Metrorrhagia is bleeding from the uterus that happens irregularly but often. The bleeding generally happens between menstrual periods. Follow these instructions at home: Pay attention to any changes in your symptoms. Follow these instructions to help with your condition: Eating and drinking  Eat well-balanced meals. Include foods that are high in iron, such as liver, meat, shellfish, green leafy vegetables, and eggs.  If you become constipated: ? Drink plenty of water. ? Eat fruits and vegetables that are high in water and fiber, such as spinach, carrots, raspberries, apples, and mango. Medicines  Take over-the-counter and prescription medicines only as told by your health care provider.  Do not change medicines without talking with your health care provider.  Aspirin or medicines that contain aspirin may make the bleeding worse. Do not take those medicines: ? During the week before your period. ? During your period.  If you were prescribed iron pills, take them as told by your health care provider. Iron pills help to replace iron that your body loses because of this condition. Activity  If you need to change your sanitary pad or tampon more than one time every 2 hours: ? Lie in bed with your feet raised (elevated). ? Place a cold pack on your lower abdomen. ? Rest as much as possible until the bleeding stops or slows down.  Do not try to lose weight until the bleeding has stopped and your blood iron level is back to normal. Other Instructions  For two months, write down: ? When your period starts. ? When your period ends. ? When any abnormal bleeding occurs. ? What problems you notice.  Keep all follow-up visits as told by your health care provider. This is important. Contact a health care provider if:  You get light-headed or weak.  You have nausea and vomiting.  You cannot eat or drink without vomiting.  You feel dizzy or have diarrhea while you are  taking medicine.  You are taking birth control pills or hormones, and you want to change them or stop taking them. Get help right away if:  You develop a fever or chills.  You need to change your sanitary pad or tampon more than one time per hour.  Your bleeding becomesheavy.  Your flow contains clots.  You develop pain in your abdomen.  You lose consciousness.  You develop a rash. This information is not intended to replace advice given to you by your health care provider. Make sure you discuss any questions you have with your health care provider. Document Released: 06/06/2005 Document Revised: 11/09/2015 Document Reviewed: 09/01/2014 Elsevier Interactive Patient Education  [employer].

## 2017-11-01 NOTE — Progress Notes (Signed)
   GYNECOLOGY OFFICE VISIT NOTE  History:  32 y.o. G0P0 here today for evaluation of irregular periods. Since the past 4-5 months, she has noticed she spots the whole week prior to her period and then has her normal period.  Cycle length used to 30 days, now it is more like 26 days. So between spotting and her periods, she is bleeding half the month which is very disconcerting.  Associated with abdominal cramping and bloating, mildly alleviated by Tylenol. Sometimes the pain is severe that she feels she will "pass out". Also reports increased vaginal discharge when not bleeding, has to wear pantyliners sometimes. No pruritus or odor. No other concerns.   Past Medical History:  Diagnosis Date  . Anxiety   . Chicken pox   . Depression   . GERD (gastroesophageal reflux disease)   . Hay fever   . Headache, migraine     Past Surgical History:  Procedure Laterality Date  . wisdom teeth      The following portions of the patient's history were reviewed and updated as appropriate: allergies, current medications, past family history, past medical history, past social history, past surgical history and problem list.   Health Maintenance:  Normal pap on 10/11/2017.  Review of Systems:  Pertinent items noted in HPI and remainder of comprehensive ROS otherwise negative.  Objective:  Physical Exam BP 119/76   Pulse 72   Ht 5\' 7"  (1.702 m)   Wt 133 lb (60.3 kg)   LMP 10/24/2017 (Exact Date)   BMI 20.83 kg/m  CONSTITUTIONAL: Well-developed, well-nourished female in no acute distress.  SKIN: Skin is warm and dry. No rash noted. Not diaphoretic. No erythema. No pallor. NEUROLOGIC: Alert and oriented to person, place, and time. Normal reflexes, muscle tone coordination. No cranial nerve deficit noted. PSYCHIATRIC: Normal mood and affect. Normal behavior. Normal judgment and thought content. CARDIOVASCULAR: Normal heart rate noted RESPIRATORY: Effort and breath sounds normal, no problems with  respiration noted ABDOMEN: Soft, nontender, no distention noted.   PELVIC: Normal appearing external genitalia; normal appearing vaginal mucosa and cervix.  No abnormal discharge noted; testing sample obtained.  Normal uterine size, no other palpable masses, no uterine tenderness. Left adnexal fullness/tenderness elicited on palpation. MUSCULOSKELETAL: Normal range of motion. No edema noted.  Labs and Imaging No results found.  Assessment & Plan:  1. Metrorrhagia Discussed possible etiologies of metrorrhagia. She has a normal exam, no evidence of lesions.  Will order abnormal uterine bleeding evaluation labs and pelvic ultrasound to evaluate for any structural gynecologic abnormalities.   Naproxen recommended to help with AUB and associated cramping. Patient declined hormonal management at this time.  - CBC - Follicle stimulating hormone - Beta hCG quant (ref lab) - Prolactin - Testosterone,Free and Total - TSH - Von Willebrand panel - US PELVIC COMPLETE WITH TRANSVAGINAL; Future - Cervicovaginal ancillary only Will contact patient with these results and plans for further evaluation/management.  Please refer to After Visit Summary for other counseling recommendations.   Return if symptoms worsen or fail to improve.   Total face-to-face time with patient: 20 minutes.  Over 50% of encounter was spent on counseling and coordination of care.   Verita Schneiders, MD, [place] for [employer], [place]

## 2017-11-03 ENCOUNTER — Encounter: Payer: Self-pay | Admitting: Obstetrics & Gynecology

## 2017-11-03 ENCOUNTER — Encounter: Payer: Self-pay | Admitting: Family Medicine

## 2017-11-03 DIAGNOSIS — D649 Anemia, unspecified: Secondary | ICD-10-CM | POA: Insufficient documentation

## 2017-11-03 LAB — CERVICOVAGINAL ANCILLARY ONLY
BACTERIAL VAGINITIS: NEGATIVE
CHLAMYDIA, DNA PROBE: NEGATIVE
Candida vaginitis: NEGATIVE
NEISSERIA GONORRHEA: NEGATIVE
Trichomonas: NEGATIVE

## 2017-11-03 LAB — VON WILLEBRAND PANEL
Factor VIII Activity: 83 % (ref 57–163)
VON WILLEBRAND AG: 85 % (ref 50–200)
VON WILLEBRAND FACTOR: 65 % (ref 50–200)

## 2017-11-03 LAB — CBC
HEMOGLOBIN: 8.3 g/dL — AB (ref 11.1–15.9)
Hematocrit: 26.3 % — ABNORMAL LOW (ref 34.0–46.6)
MCH: 24.9 pg — ABNORMAL LOW (ref 26.6–33.0)
MCHC: 31.6 g/dL (ref 31.5–35.7)
MCV: 79 fL (ref 79–97)
NRBC: 1 % — AB (ref 0–0)
Platelets: 343 10*3/uL (ref 150–379)
RBC: 3.33 x10E6/uL — AB (ref 3.77–5.28)
RDW: 16.4 % — ABNORMAL HIGH (ref 12.3–15.4)
WBC: 13.1 10*3/uL — ABNORMAL HIGH (ref 3.4–10.8)

## 2017-11-03 LAB — PROLACTIN: Prolactin: 9.4 ng/mL (ref 4.8–23.3)

## 2017-11-03 LAB — TESTOSTERONE,FREE AND TOTAL
Testosterone, Free: 1.1 pg/mL (ref 0.0–4.2)
Testosterone: 21 ng/dL (ref 8–48)

## 2017-11-03 LAB — BETA HCG QUANT (REF LAB)

## 2017-11-03 LAB — COAG STUDIES INTERP REPORT

## 2017-11-03 LAB — TSH: TSH: 1.11 u[IU]/mL (ref 0.450–4.500)

## 2017-11-03 LAB — FOLLICLE STIMULATING HORMONE: FSH: 4 m[IU]/mL

## 2017-11-05 ENCOUNTER — Ambulatory Visit (HOSPITAL_BASED_OUTPATIENT_CLINIC_OR_DEPARTMENT_OTHER): Payer: 59

## 2017-11-06 ENCOUNTER — Other Ambulatory Visit (HOSPITAL_BASED_OUTPATIENT_CLINIC_OR_DEPARTMENT_OTHER): Payer: 59

## 2017-11-06 ENCOUNTER — Other Ambulatory Visit: Payer: Self-pay | Admitting: Obstetrics & Gynecology

## 2017-11-06 DIAGNOSIS — D649 Anemia, unspecified: Secondary | ICD-10-CM

## 2017-11-07 ENCOUNTER — Ambulatory Visit (HOSPITAL_BASED_OUTPATIENT_CLINIC_OR_DEPARTMENT_OTHER)
Admission: RE | Admit: 2017-11-07 | Discharge: 2017-11-07 | Disposition: A | Discharge status: Home / Self Care | Payer: 59 | Source: Ambulatory Visit | Attending: Obstetrics & Gynecology | Admitting: Obstetrics & Gynecology

## 2017-11-07 ENCOUNTER — Other Ambulatory Visit (HOSPITAL_BASED_OUTPATIENT_CLINIC_OR_DEPARTMENT_OTHER): Payer: 59

## 2017-11-07 ENCOUNTER — Other Ambulatory Visit: Payer: 59

## 2017-11-07 DIAGNOSIS — D649 Anemia, unspecified: Secondary | ICD-10-CM | POA: Diagnosis not present

## 2017-11-07 DIAGNOSIS — N838 Other noninflammatory disorders of ovary, fallopian tube and broad ligament: Secondary | ICD-10-CM | POA: Insufficient documentation

## 2017-11-07 DIAGNOSIS — N921 Excessive and frequent menstruation with irregular cycle: Secondary | ICD-10-CM | POA: Insufficient documentation

## 2017-11-07 NOTE — Progress Notes (Signed)
Patient presents for lab draw for future orders that Dr. Harolyn Rutherford placed in her chart yesterday.patient sent to lab. Kathrene Alu RN

## 2017-11-08 ENCOUNTER — Ambulatory Visit (HOSPITAL_BASED_OUTPATIENT_CLINIC_OR_DEPARTMENT_OTHER): Payer: 59

## 2017-11-10 ENCOUNTER — Encounter: Payer: Self-pay | Admitting: Obstetrics & Gynecology

## 2017-11-10 LAB — HEMOGLOBINOPATHY EVALUATION
FERRITIN: 20 ng/mL (ref 15–150)
HGB A: 96.6 % (ref 96.4–98.8)
HGB F QUANT: 0.9 % (ref 0.0–2.0)
Hematocrit: 40.1 % (ref 34.0–46.6)
Hemoglobin: 13.9 g/dL (ref 11.1–15.9)
Hgb A2 Quant: 2.5 % (ref 1.8–3.2)
Hgb C: 0 %
Hgb S: 0 %
Hgb Solubility: NEGATIVE
Hgb Variant: 0 %
MCH: 30.3 pg (ref 26.6–33.0)
MCHC: 34.7 g/dL (ref 31.5–35.7)
MCV: 87 fL (ref 79–97)
PLATELETS: 193 10*3/uL (ref 150–450)
RBC: 4.59 x10E6/uL (ref 3.77–5.28)
RDW: 12.7 % (ref 12.3–15.4)
WBC: 9.2 10*3/uL (ref 3.4–10.8)

## 2017-11-10 LAB — VITAMIN B12: VITAMIN B 12: 412 pg/mL (ref 232–1245)

## 2017-11-10 LAB — IRON AND TIBC
IRON SATURATION: 21 % (ref 15–55)
Iron: 65 ug/dL (ref 27–159)
TIBC: 317 ug/dL (ref 250–450)
UIBC: 252 ug/dL (ref 131–425)

## 2017-11-10 LAB — FOLATE: Folate: 15.9 ng/mL (ref >3.0)

## 2017-11-14 ENCOUNTER — Encounter: Payer: Self-pay | Admitting: Family Medicine

## 2017-11-17 ENCOUNTER — Encounter: Payer: 59 | Admitting: Obstetrics & Gynecology

## 2017-11-21 ENCOUNTER — Other Ambulatory Visit: Payer: Self-pay | Admitting: Family Medicine

## 2017-11-21 DIAGNOSIS — F411 Generalized anxiety disorder: Secondary | ICD-10-CM

## 2017-11-21 DIAGNOSIS — F410 Panic disorder [episodic paroxysmal anxiety] without agoraphobia: Secondary | ICD-10-CM

## 2017-11-22 MED ORDER — ALPRAZOLAM 0.25 MG PO TABS: 0.2500 mg | ORAL_TABLET | Freq: Two times a day (BID) | ORAL | 0 refills | Status: DC | PRN

## 2017-11-22 MED FILL — ALPRAZolam 0.25 MG TABS: 0.25 | 15 days supply | Qty: 30 | Fill #0

## 2017-11-22 NOTE — Telephone Encounter (Signed)
Requesting:Alprazolam Contract:none, needs csc TRV:UYEB, needs uds Last Visit:10/11/17 Next Visit:none with pcp Last Refill:08/29/17  Please Advise

## 2017-11-29 ENCOUNTER — Encounter: Payer: Self-pay | Admitting: Obstetrics & Gynecology

## 2017-12-05 ENCOUNTER — Other Ambulatory Visit (INDEPENDENT_AMBULATORY_CARE_PROVIDER_SITE_OTHER): Payer: 59

## 2017-12-05 ENCOUNTER — Encounter: Payer: Self-pay | Admitting: Physician Assistant

## 2017-12-05 ENCOUNTER — Ambulatory Visit: Payer: 59 | Admitting: Physician Assistant

## 2017-12-05 VITALS — BP 96/64 | HR 72 | Ht 67.0 in | Wt 134.0 lb

## 2017-12-05 DIAGNOSIS — R140 Abdominal distension (gaseous): Secondary | ICD-10-CM | POA: Diagnosis not present

## 2017-12-05 DIAGNOSIS — K5900 Constipation, unspecified: Secondary | ICD-10-CM

## 2017-12-05 DIAGNOSIS — R143 Flatulence: Secondary | ICD-10-CM

## 2017-12-05 LAB — HIGH SENSITIVITY CRP: CRP HIGH SENSITIVITY: 0.63 mg/L (ref 0.000–5.000)

## 2017-12-05 LAB — IGA: IgA: 226 mg/dL (ref 68–378)

## 2017-12-05 NOTE — Progress Notes (Addendum)
Subjective:    Patient ID: Kimberly Blankenship, female    DOB: Apr 02, 1986, 32 y.o.   MRN: [id number]  HPI Kimberly Blankenship is a pleasant 32 year old white female, known to Dr. Hilarie Fredrickson and myself.  She was initially seen here in December 2018, at that time with complaints of epigastric pain more consistent with acute gastritis.  She had had H. pylori breath testing done which was negative and improved with a course of Zantac twice daily. Patient says her current symptoms are different.  She is have been having difficulty with gas bloating and constipation over the past 4 to 6 weeks.  She says she is not really having any abdominal pain but at times has an unsettled feeling in her abdomen.  He has had no nausea or vomiting.  She is having some bowel movement almost every day but says frequently this is just small pellet-like stools.  She has not noticed any blood.  She is started herself on Benefiber daily just recently and also a probiotic that she got from the health food store.  He thinks these may be helping a little bit. She has not been on any other new medications, no changes in diet supplements or stress level.  She says she actually got a new job and thinks her stress is better than it had been in the past. She is not aware of any specific food triggers or food intolerances.  She has been keeping a diet log which she brought with her today. Family history is negative for GI disease as far she is aware. She had just undergone a work-up in May 2019 for anemia however on repeat CBC a few weeks later her hemoglobin had gone from 8.3 up to 13.9.  All of her anemia panels were unremarkable, no iron deficiency or B12 deficiency.  I suspect the hemoglobin of 8.3 was a lab error.  Review of Systems Pertinent positive and negative review of systems were noted in the above HPI section.  All other review of systems was otherwise negative.  Outpatient Encounter Medications as of 12/05/2017  Medication Sig  .  ACETAMINOPHEN-BUTALBITAL 50-325 MG TABS May take 1-2 tablets every 4 hours as needed for headache. Max 6 per 24 hours  . ALPRAZolam (XANAX) 0.25 MG tablet Take 1 tablet (0.25 mg total) by mouth 2 (two) times daily as needed for anxiety.  . Ascorbic Acid (VITAMIN C) 1000 MG tablet Take 1,000 mg by mouth daily.  . Cholecalciferol (VITAMIN D3) 5000 units CAPS Take 1 capsule by mouth daily.  . Ergocalciferol (VITAMIN D2) 2000 units TABS Take 1 tablet by mouth daily.  . fluticasone (FLONASE) 50 MCG/ACT nasal spray Place 2 sprays into both nostrils daily.  Marland Kitchen levocetirizine (XYZAL) 5 MG tablet Take 1 tablet (5 mg total) by mouth every evening.  Marland Kitchen MAGNESIUM PO Take by mouth.  . Menaquinone-7 (VITAMIN K2) 100 MCG CAPS Take 1 tablet by mouth daily.  . Selenium 200 MCG TABS Take 1 tablet by mouth daily.  . vitamin B-12 (CYANOCOBALAMIN) 1000 MCG tablet Take 1,000 mcg by mouth daily.  Marland Kitchen zinc gluconate 50 MG tablet Take 50 mg by mouth daily.  . [DISCONTINUED] FLUoxetine (PROZAC) 20 MG tablet Take 1 tablet (20 mg total) by mouth daily. Increase to 2 pills after 2-3 weeks   No facility-administered encounter medications on file as of 12/05/2017.    Allergies  Allergen Reactions  . Sulfa Drugs Cross Reactors Rash   Patient Active Problem List   Diagnosis Date Noted  .  Anemia 11/03/2017  . Metrorrhagia 11/01/2017  . Great toe pain, left 11/03/2016   Social History   Socioeconomic History  . Marital status: Married    Spouse name: Not on file  . Number of children: 0  . Years of education: Not on file  . Highest education level: Not on file  Occupational History  . Occupation: admin assist  Social Needs  . Financial resource strain: Not on file  . Food insecurity:    Worry: Not on file    Inability: Not on file  . Transportation needs:    Medical: Not on file    Non-medical: Not on file  Tobacco Use  . Smoking status: Never Smoker  . Smokeless tobacco: Never Used  Substance and Sexual  Activity  . Alcohol use: Yes    Alcohol/week: 0.6 oz    Types: 1 Standard drinks or equivalent per week    Comment: 1-2 once a week  . Drug use: No  . Sexual activity: Yes    Birth control/protection: Pill    Comment: viorelle  Lifestyle  . Physical activity:    Days per week: Not on file    Minutes per session: Not on file  . Stress: Not on file  Relationships  . Social connections:    Talks on phone: Not on file    Gets together: Not on file    Attends religious service: Not on file    Active member of club or organization: Not on file    Attends meetings of clubs or organizations: Not on file    Relationship status: Not on file  . Intimate partner violence:    Fear of current or ex partner: Not on file    Emotionally abused: Not on file    Physically abused: Not on file    Forced sexual activity: Not on file  Other Topics Concern  . Not on file  Social History Narrative  . Not on file    [name]'s family history includes Arthritis in her maternal grandmother, mother, and paternal grandmother; COPD in her paternal grandmother; Depression in her brother; Diabetes in her mother; Fibroids in her maternal grandmother and mother; Hypertension in her maternal grandfather; Miscarriages / [place] in her mother; Other in her paternal grandfather; Stomach cancer in her maternal grandfather; Uterine cancer in her paternal aunt.      Objective:    Vitals:   12/05/17 0833  BP: 96/64  Pulse: 72    Physical Exam; well-developed young white female in no acute distress, pleasant blood pressure 96/64 pulse 72.  Blood pressure 96/64, pulse 72, BMI 20.9.  HEENT ;nontraumatic normocephalic EOMI PERRLA sclera anicteric, Cardiovascular; regular rate and rhythm with S1-S2 no murmur rub or gallop, Pulmonary ;clear bilaterally, Abdomen; soft, nontender nondistended bowel sounds are active there is no palpable mass or hepatosplenomegaly, Rectal ;exam not done, Extremities; no clubbing  cyanosis or edema skin warm dry, Neuro psych; alert and oriented, grossly nonfocal mood and affect appropriate       Assessment & Plan:   #60 32 year old white female with 4 to 6-week history of abdominal bloating, mild constipation and gas.  Suspect her symptoms are primarily secondary to constipation, rule out IBS, rule out celiac disease  #2 chronic anxiety  Plan; patient will continue Benefiber 2 to 3 teaspoons daily and a large glass of water. She has not been drinking much water throughout the day, she is advised to try to drink at least 60 ounces of water every  day Resume regular physical activity Continue daily probiotic Add trial of IBgard to p.o. twice daily. Patient was given a copy of low gas diet, advised to continue avoidance of artificial sweeteners and carbonated beverages and to start eliminate any foods that she is eating on a regular basis which are high gas producers. We will check CRP TTG and IgA Patient will follow-up in 2 to 3 months as needed.  Amy S Esterwood PA-C 12/05/2017   Cc: Copland, Gay Filler, MD   Addendum: Reviewed and agree with initial management. Pyrtle, Lajuan Lines, MD

## 2017-12-05 NOTE — Patient Instructions (Addendum)
Your provider has requested that you go to the basement level for lab work before leaving today. Press "B" on the elevator. The lab is located at the first door on the left as you exit the elevator. Continue the probiotic.  Increase water intake to 60 oz a day as least.  We have provided you with a coupon for IBGard. You can get this at any pharmacy or [place], [place], [employer].  Take 1 capsule by mouth twice daily.   Take Benefiber daily.  You can get this at your pharmacy or [facility]. Kimberly Blankenship has the Equate brand which is the same thing.   Follow up in 4-6 weeks as needed.   If you are age 52 or younger, your body mass index should be between 19-25. Your Body mass index is 20.99 kg/m. If this is out of the aformentioned range listed, please consider follow up with your Primary Care Provider.

## 2017-12-06 ENCOUNTER — Encounter: Payer: Self-pay | Admitting: Physician Assistant

## 2017-12-07 LAB — TISSUE TRANSGLUTAMINASE, IGG: (tTG) Ab, IgG: 2 U/mL

## 2017-12-12 ENCOUNTER — Telehealth: Payer: Self-pay

## 2017-12-12 DIAGNOSIS — D271 Benign neoplasm of left ovary: Secondary | ICD-10-CM

## 2017-12-12 NOTE — Telephone Encounter (Signed)
Patient will have surgery in Sept.   Dr. Harolyn Rutherford would like patient to have follow up ultrasound the first part of Sept before surgery. Kathrene Alu RN

## 2017-12-20 ENCOUNTER — Encounter (HOSPITAL_COMMUNITY): Payer: Self-pay

## 2017-12-26 ENCOUNTER — Encounter: Payer: Self-pay | Admitting: Obstetrics & Gynecology

## 2017-12-29 ENCOUNTER — Encounter: Payer: Self-pay | Admitting: Physician Assistant

## 2018-01-05 ENCOUNTER — Ambulatory Visit: Payer: 59 | Admitting: Physician Assistant

## 2018-01-09 ENCOUNTER — Encounter: Payer: Self-pay | Admitting: Family Medicine

## 2018-01-16 ENCOUNTER — Ambulatory Visit: Payer: 59 | Admitting: Physician Assistant

## 2018-01-17 NOTE — Progress Notes (Addendum)
[place] at [facility] [address] [place], [place] [address] [phone]  Date:  01/18/2018   Name:  Kimberly Blankenship   DOB:  [birth year]   MRN:  [id number]  PCP:  Darreld Mclean, MD    Chief Complaint: [facility] Certification (needing tb lab work, form to be filled out)   History of Present Illness:  Kimberly Blankenship is a 32 y.o. very pleasant female patient who presents with the following:  Generally healthy young woman who is working on becoming certified as a foster parent.  She needs to be screened for TB and have a brief form completed today She and her husband are excited but also nervous about becoming foster parents She does have mild anxiety but this has not been a big issues for her. She takes a xanax on rare occasion  She may get a HA once a week - usually resolves with an OTC med, she will take an occasional fioricet She may get a more severe HA- migraine- once a month or so She cannot determine any pattern She does not get aura, but may be nauseated and could vomit Laying down in a quiet room helps, lights and sounds are bothersome She may have to sleep all night to get it to go away     Patient Active Problem List   Diagnosis Date Noted  . Anemia 11/03/2017  . Metrorrhagia 11/01/2017  . Great toe pain, left 11/03/2016    Past Medical History:  Diagnosis Date  . Anxiety   . Chicken pox   . Depression   . GERD (gastroesophageal reflux disease)   . Hay fever   . Headache, migraine     Past Surgical History:  Procedure Laterality Date  . wisdom teeth      Social History   Tobacco Use  . Smoking status: Never Smoker  . Smokeless tobacco: Never Used  Substance Use Topics  . Alcohol use: Yes    Alcohol/week: 0.6 oz    Types: 1 Standard drinks or equivalent per week    Comment: 1-2 once a week  . Drug use: No    Family History  Problem Relation Age of Onset  . Arthritis Mother   . Diabetes  Mother   . Miscarriages / [place] Mother   . Fibroids Mother   . Depression Brother   . Arthritis Maternal Grandmother   . Fibroids Maternal Grandmother   . Hypertension Maternal Grandfather   . Stomach cancer Maternal Grandfather   . Arthritis Paternal Grandmother   . COPD Paternal Grandmother   . Other Paternal Grandfather        Brain tumor  . Uterine cancer Paternal Aunt     Allergies  Allergen Reactions  . Sulfa Drugs Cross Reactors Rash    Medication list has been reviewed and updated.  Current Outpatient Medications on File Prior to Visit  Medication Sig Dispense Refill  . ACETAMINOPHEN-BUTALBITAL 50-325 MG TABS May take 1-2 tablets every 4 hours as needed for headache. Max 6 per 24 hours 30 each 0  . ALPRAZolam (XANAX) 0.25 MG tablet Take 1 tablet (0.25 mg total) by mouth 2 (two) times daily as needed for anxiety. 30 tablet 0  . Ascorbic Acid (VITAMIN C) 1000 MG tablet Take 1,000 mg by mouth daily.    . Cholecalciferol (VITAMIN D3) 5000 units CAPS Take 1 capsule by mouth daily.    . Ergocalciferol (VITAMIN D2) 2000 units TABS  Take 1 tablet by mouth daily.    . fluticasone (FLONASE) 50 MCG/ACT nasal spray Place 2 sprays into both nostrils daily. 16 g 2  . levocetirizine (XYZAL) 5 MG tablet Take 1 tablet (5 mg total) by mouth every evening. 30 tablet 2  . MAGNESIUM PO Take by mouth.    . Menaquinone-7 (VITAMIN K2) 100 MCG CAPS Take 1 tablet by mouth daily.    . Selenium 200 MCG TABS Take 1 tablet by mouth daily.    . vitamin B-12 (CYANOCOBALAMIN) 1000 MCG tablet Take 1,000 mcg by mouth daily.    Marland Kitchen zinc gluconate 50 MG tablet Take 50 mg by mouth daily.     No current facility-administered medications on file prior to visit.     Review of Systems:  As per HPI- otherwise negative. No fever or chills No CP or SOB No rash or ST    Physical Examination: Vitals:   01/18/18 1718  BP: 120/70  Pulse: 75  Resp: 16  SpO2: 99%   Vitals:   01/18/18 1718  Weight:  134 lb 9.6 oz (61.1 kg)  Height: 5\' 7"  (1.702 m)   Body mass index is 21.08 kg/m. Ideal Body Weight: Weight in (lb) to have BMI = 25: 159.3  GEN: WDWN, NAD, Non-toxic, A & O x 3, slim build, looks well  HEENT: Atraumatic, Normocephalic. Neck supple. No masses, No LAD.  Bilateral TM wnl, oropharynx normal.  PEERL,EOMI.   No meningismus Ears and Nose: No external deformity. CV: RRR, No M/G/R. No JVD. No thrill. No extra heart sounds. PULM: CTA B, no wheezes, crackles, rhonchi. No retractions. No resp. distress. No accessory muscle use.Marland Kitchen EXTR: No c/c/e NEURO Normal gait.  PSYCH: Normally interactive. Conversant. Not depressed or anxious appearing.  Calm demeanor.    Assessment and Plan: Screening examination for pulmonary tuberculosis - Plan: QuantiFERON-TB Gold Plus  Migraine without aura and without status migrainosus, not intractable - Plan: rizatriptan (MAXALT) 10 MG tablet  Completed form and did Quantiferon gold screening today She is interested in trying a specific migraine med for her occasional migraine - rx for maxalt for her to try Will plan further follow- up pending labs.  Signed Lamar Blinks, MD  Received her quantiferon gold test- 8/4 Results for orders placed or performed in visit on 01/18/18  QuantiFERON-TB Gold Plus  Result Value Ref Range   QuantiFERON-TB Gold Plus NEGATIVE NEGATIVE   NIL 0.03 IU/mL   Mitogen-NIL >10.00 IU/mL   TB1-NIL 0.00 IU/mL   TB2-NIL 0.00 IU/mL   Message to pt

## 2018-01-18 ENCOUNTER — Ambulatory Visit: Payer: 59 | Admitting: Family Medicine

## 2018-01-18 ENCOUNTER — Encounter: Payer: Self-pay | Admitting: Family Medicine

## 2018-01-18 VITALS — BP 120/70 | HR 75 | Resp 16 | Ht 67.0 in | Wt 134.6 lb

## 2018-01-18 DIAGNOSIS — G43009 Migraine without aura, not intractable, without status migrainosus: Secondary | ICD-10-CM | POA: Diagnosis not present

## 2018-01-18 DIAGNOSIS — Z111 Encounter for screening for respiratory tuberculosis: Secondary | ICD-10-CM

## 2018-01-18 DIAGNOSIS — D229 Melanocytic nevi, unspecified: Secondary | ICD-10-CM | POA: Diagnosis not present

## 2018-01-18 MED ORDER — RIZATRIPTAN BENZOATE 10 MG PO TABS: 10.0000 mg | ORAL_TABLET | ORAL | 3 refills | Status: DC | PRN

## 2018-01-18 MED FILL — RIZATRIPTAN BENZOATE 10 MG: 10 | 2 days supply | Qty: 4 | Fill #0

## 2018-01-18 NOTE — Patient Instructions (Signed)
Great to see you today!  Best of luck as you complete the foster parent process, and I will be in touch with your TB test results asap Try the maxalt at the first sign of a migraine headache- hopefully this will keep the headache from getting bad!

## 2018-01-19 ENCOUNTER — Other Ambulatory Visit: Payer: 59

## 2018-01-20 ENCOUNTER — Encounter: Payer: Self-pay | Admitting: Family Medicine

## 2018-01-21 LAB — QUANTIFERON-TB GOLD PLUS
NIL: 0.03 [IU]/mL
QuantiFERON-TB Gold Plus: NEGATIVE
TB1-NIL: 0 [IU]/mL
TB2-NIL: 0 [IU]/mL

## 2018-02-05 ENCOUNTER — Other Ambulatory Visit: Payer: Self-pay

## 2018-02-05 DIAGNOSIS — N926 Irregular menstruation, unspecified: Secondary | ICD-10-CM

## 2018-02-05 NOTE — Progress Notes (Signed)
Destiny from the imaging department called about pt's [place] order. Pt had a US pelvic complete and transvag done in May and was told that she was only having a follow up transvag US instead of a repeat of both ultrasounds. Order was changed to transvag only.

## 2018-02-07 ENCOUNTER — Other Ambulatory Visit: Payer: Self-pay | Admitting: Family Medicine

## 2018-02-07 MED ORDER — BUTALBITAL-ACETAMINOPHEN 50-325 MG PO TABS: ORAL_TABLET | ORAL | 0 refills | Status: DC

## 2018-02-08 MED FILL — BUTALBITAL-ACETAMINOPHN 50-: 50-325 | 5 days supply | Qty: 30 | Fill #0

## 2018-02-09 ENCOUNTER — Encounter: Payer: Self-pay | Admitting: Family Medicine

## 2018-02-09 DIAGNOSIS — G43009 Migraine without aura, not intractable, without status migrainosus: Secondary | ICD-10-CM

## 2018-02-09 NOTE — Telephone Encounter (Signed)
I have pended referral to neurology.

## 2018-02-12 ENCOUNTER — Encounter: Payer: Self-pay | Admitting: Neurology

## 2018-02-15 ENCOUNTER — Ambulatory Visit (HOSPITAL_BASED_OUTPATIENT_CLINIC_OR_DEPARTMENT_OTHER)
Admission: RE | Admit: 2018-02-15 | Discharge: 2018-02-15 | Disposition: A | Discharge status: Home / Self Care | Payer: 59 | Source: Ambulatory Visit | Attending: Obstetrics & Gynecology | Admitting: Obstetrics & Gynecology

## 2018-02-15 DIAGNOSIS — N926 Irregular menstruation, unspecified: Secondary | ICD-10-CM | POA: Insufficient documentation

## 2018-02-22 ENCOUNTER — Other Ambulatory Visit: Payer: Self-pay | Admitting: Family Medicine

## 2018-02-22 DIAGNOSIS — F410 Panic disorder [episodic paroxysmal anxiety] without agoraphobia: Secondary | ICD-10-CM

## 2018-02-22 DIAGNOSIS — F411 Generalized anxiety disorder: Secondary | ICD-10-CM

## 2018-02-22 MED ORDER — ALPRAZOLAM 0.25 MG PO TABS: 0.2500 mg | ORAL_TABLET | Freq: Two times a day (BID) | ORAL | 1 refills | Status: DC | PRN

## 2018-02-22 MED FILL — ALPRAZolam 0.25 MG TABS: 0.25 | 15 days supply | Qty: 30 | Fill #0

## 2018-02-23 ENCOUNTER — Encounter (HOSPITAL_COMMUNITY): Payer: Self-pay

## 2018-03-07 ENCOUNTER — Telehealth: Payer: Self-pay

## 2018-03-07 NOTE — Telephone Encounter (Signed)
Called pt to schedule an appt before her surgery. Pt states that she will be changing her surgery date.

## 2018-03-07 NOTE — Telephone Encounter (Signed)
-----   Message from Osborne Oman, MD sent at 03/06/2018 11:50 AM EDT ----- Sure. 2-3 weeks prior will be fine. Am I scheduled to be there?  [name] ----- Message ----- From: Valentina Lucks, CMA Sent: 03/06/2018  11:45 AM EDT To: Osborne Oman, MD  Hi Dr. A, will this patient need to come in to be seen before her surgery? The pt called the front office and wanted to know.

## 2018-03-09 NOTE — Telephone Encounter (Signed)
Spoke with patient about scheduling an appointment to come in before surgery. Pt states that the only time she is off work is the week of Thanksgiving but she was unable to be scheduled for surgery. Pt states that she will have time off of work the beginning of the year and will call the office in December to schedule an appointment.

## 2018-03-12 ENCOUNTER — Encounter: Payer: Self-pay | Admitting: Physician Assistant

## 2018-03-12 ENCOUNTER — Other Ambulatory Visit: Payer: 59

## 2018-03-12 ENCOUNTER — Other Ambulatory Visit: Payer: Self-pay | Admitting: *Deleted

## 2018-03-12 ENCOUNTER — Ambulatory Visit: Payer: 59 | Admitting: Physician Assistant

## 2018-03-12 VITALS — BP 110/58 | HR 84 | Ht 67.0 in | Wt 136.6 lb

## 2018-03-12 DIAGNOSIS — R1084 Generalized abdominal pain: Secondary | ICD-10-CM | POA: Diagnosis not present

## 2018-03-12 DIAGNOSIS — R109 Unspecified abdominal pain: Secondary | ICD-10-CM

## 2018-03-12 DIAGNOSIS — R197 Diarrhea, unspecified: Secondary | ICD-10-CM

## 2018-03-12 MED ORDER — DICYCLOMINE HCL 10 MG PO CAPS: ORAL_CAPSULE | ORAL | 4 refills | Status: DC

## 2018-03-12 NOTE — Patient Instructions (Addendum)
Your provider has requested that you go to the basement level for stool studies before leaving today. Press "B" on the elevator. The lab is located at the first door on the left as you exit the elevator.   We have sent the following medications to your pharmacy for you to pick up at your convenience: [place], [address], [address]  1. Bentyl 10 mg 2. Suprep for the colonoscopy prep.  You have been scheduled for a colonoscopy. Please follow written instructions given to you at your visit today.  Please pick up your prep supplies at the pharmacy within the next 1-3 days. If you use inhalers (even only as needed), please bring them with you on the day of your procedure. Your physician has requested that you go to www.startemmi.com and enter the access code given to you at your visit today. This web site gives a general overview about your procedure. However, you should still follow specific instructions given to you by our office regarding your preparation for the procedure.

## 2018-03-12 NOTE — Progress Notes (Addendum)
Subjective:    Patient ID: Kimberly Blankenship, female    DOB: 1985/11/28, 32 y.o.   MRN: [id number]  HPI Kimberly Blankenship is a pleasant 32 year old white female, now established with Dr. Hilarie Fredrickson.  She was initially seen in June 2019 by myself as a new patient with 4 to 6-week history of abdominal gas and bloating and change in bowel habits.  It was felt symptoms likely secondary to IBS. CRP was done and normal, TTG and IgA both within normal limits.  She was started on [employer] liberal water intake, and IBgard.  She was also given a low gas diet. Comes in today stating she has had ongoing symptoms ever since then and is now having diarrhea with about 4 bowel movements per day.  She says she hardly ever has any sort of normal bowel movement.  She has not noticed any bleeding.  She is been having ongoing problems with an unsettled feeling in her abdomen, and cramping in the mid abdomen describes an occasional prickly sensation around her umbilicus.  She also describes excessive fullness at times She stopped taking a probiotic which she had started, stop the fiber supplement and the IBgard as she did not feel that any of them were helping and the IBgard actually made her stomach hurt. Weight has been stable, no nausea or vomiting. She asked about being retested for H. pylori as she had completed a regimen for H. pylori with her PCP last fall.  She says she had breath testing done wonders if accurate because she had not been off of PPI therapy for very long.  Review of Systems Pertinent positive and negative review of systems were noted in the above HPI section.  All other review of systems was otherwise negative.  Outpatient Encounter Medications as of 03/12/2018  Medication Sig  . ACETAMINOPHEN-BUTALBITAL 50-325 MG TABS May take 1-2 tablets every 4 hours as needed for headache. Max 6 per 24 hours  . ALPRAZolam (XANAX) 0.25 MG tablet Take 1 tablet (0.25 mg total) by mouth 2 (two) times daily as needed for  anxiety.  . Ascorbic Acid (VITAMIN C) 1000 MG tablet Take 1,000 mg by mouth daily.  . Cholecalciferol (VITAMIN D3) 5000 units CAPS Take 1 capsule by mouth daily.  . Ergocalciferol (VITAMIN D2) 2000 units TABS Take 1 tablet by mouth daily.  . fluticasone (FLONASE) 50 MCG/ACT nasal spray Place 2 sprays into both nostrils daily.  Marland Kitchen levocetirizine (XYZAL) 5 MG tablet Take 1 tablet (5 mg total) by mouth every evening.  Marland Kitchen MAGNESIUM PO Take by mouth.  . Menaquinone-7 (VITAMIN K2) 100 MCG CAPS Take 1 tablet by mouth daily.  . vitamin B-12 (CYANOCOBALAMIN) 1000 MCG tablet Take 1,000 mcg by mouth daily.  Marland Kitchen zinc gluconate 50 MG tablet Take 50 mg by mouth daily.  Marland Kitchen dicyclomine (BENTYL) 10 MG capsule Take 1 capsule twice daily as needed for cramping and diarrhea.  . [DISCONTINUED] rizatriptan (MAXALT) 10 MG tablet Take 1 tablet (10 mg total) by mouth as needed for migraine. May repeat in 2 hours if needed. Max 30 mg in 24 hours  . [DISCONTINUED] Selenium 200 MCG TABS Take 1 tablet by mouth daily.   No facility-administered encounter medications on file as of 03/12/2018.    Allergies  Allergen Reactions  . Sulfa Drugs Cross Reactors Rash   Patient Active Problem List   Diagnosis Date Noted  . Anemia 11/03/2017  . Metrorrhagia 11/01/2017  . Great toe pain, left 11/03/2016   Social History  Socioeconomic History  . Marital status: Married    Spouse name: Not on file  . Number of children: 0  . Years of education: Not on file  . Highest education level: Not on file  Occupational History  . Occupation: admin assist  Social Needs  . Financial resource strain: Not on file  . Food insecurity:    Worry: Not on file    Inability: Not on file  . Transportation needs:    Medical: Not on file    Non-medical: Not on file  Tobacco Use  . Smoking status: Never Smoker  . Smokeless tobacco: Never Used  Substance and Sexual Activity  . Alcohol use: Yes    Alcohol/week: 1.0 standard drinks    Types:  1 Standard drinks or equivalent per week    Comment: 1-2 once a week  . Drug use: No  . Sexual activity: Yes    Birth control/protection: Pill    Comment: viorelle  Lifestyle  . Physical activity:    Days per week: Not on file    Minutes per session: Not on file  . Stress: Not on file  Relationships  . Social connections:    Talks on phone: Not on file    Gets together: Not on file    Attends religious service: Not on file    Active member of club or organization: Not on file    Attends meetings of clubs or organizations: Not on file    Relationship status: Not on file  . Intimate partner violence:    Fear of current or ex partner: Not on file    Emotionally abused: Not on file    Physically abused: Not on file    Forced sexual activity: Not on file  Other Topics Concern  . Not on file  Social History Narrative  . Not on file    Ms. Lodes's family history includes Arthritis in her maternal grandmother, mother, and paternal grandmother; COPD in her paternal grandmother; Depression in her brother; Diabetes in her mother; Fibroids in her maternal grandmother and mother; Hypertension in her maternal grandfather; Miscarriages / [place] in her mother; Other in her paternal grandfather; Stomach cancer in her maternal grandfather; Uterine cancer in her paternal aunt.      Objective:    Vitals:   03/12/18 1111  BP: (!) 110/58  Pulse: 84    Physical Exam; well-developed young white female in no acute distress, pleasant pressure 110/58 pulse 84, height 5 foot 7, weight 136, BMI 21.3 HEENT ;nontraumatic normocephalic EOMI PERRLA sclera anicteric oral mucosa moist, Cardiovascular; regular rate and rhythm with S1-S2 no murmur rub gallop, Pulmonary ;clear bilaterally, Abdomen; soft, nondistended bowel sounds are active she has some mild tenderness in the mid hypogastrium and lower abdomen no guarding or rebound no palpable mass or hepatosplenomegaly, Rectal; exam not done, Extremities;  no clubbing cyanosis or edema skin warm and dry, neuro psych; alert and oriented, grossly nonfocal mood and affect appropriate       Assessment & Plan:   #20 32 year old white female with 30-month history, new onset mid abdominal crampy discomfort, bloating and now with persistent diarrhea with 4-5 bowel movements per day over the past 2 to 3 months. Initial celiac testing was negative, no response to probiotic or IBgard.  Symptoms may all be secondary to IBS, but feel is indicated to rule out IBD at this time,  #2 history of H. pylori, treated fall 2018  Plan; H. pylori stool antigen, stool for O&P  rule out giardiasis We will schedule for colonoscopy with Dr. Hilarie Fredrickson.  Procedure was discussed in detail with patient including indications risks and benefits and she is agreeable to proceed. Give her a trial of Bentyl 10 mg p.o. every morning, may use twice daily as needed. Further plans pending results of colonoscopy.  Plan;  Alfredia Ferguson PA-C 03/12/2018   Cc: Darreld Mclean, MD   Addendum: Reviewed and agree with management. Pyrtle, Lajuan Lines, MD

## 2018-03-12 NOTE — Progress Notes (Unsigned)
amb  

## 2018-03-13 ENCOUNTER — Ambulatory Visit: Admit: 2018-03-13 | Payer: 59 | Admitting: Obstetrics & Gynecology

## 2018-03-13 SURGERY — EXCISION, CYST, OVARY, LAPAROSCOPIC
Anesthesia: Choice

## 2018-03-20 ENCOUNTER — Other Ambulatory Visit: Payer: 59

## 2018-03-20 DIAGNOSIS — R109 Unspecified abdominal pain: Secondary | ICD-10-CM | POA: Diagnosis not present

## 2018-03-20 DIAGNOSIS — R197 Diarrhea, unspecified: Secondary | ICD-10-CM

## 2018-03-20 DIAGNOSIS — R1084 Generalized abdominal pain: Secondary | ICD-10-CM

## 2018-03-22 LAB — H. PYLORI ANTIGEN, STOOL: H pylori Ag, Stl: NEGATIVE

## 2018-03-23 LAB — OVA AND PARASITE EXAMINATION
CONCENTRATE RESULT:: NONE SEEN
MICRO NUMBER:: 91177611
SPECIMEN QUALITY: ADEQUATE
TRICHROME RESULT: NONE SEEN

## 2018-03-27 ENCOUNTER — Encounter: Payer: Self-pay | Admitting: Family Medicine

## 2018-03-27 ENCOUNTER — Other Ambulatory Visit: Payer: Self-pay

## 2018-03-27 DIAGNOSIS — K5900 Constipation, unspecified: Secondary | ICD-10-CM

## 2018-03-27 DIAGNOSIS — R1084 Generalized abdominal pain: Secondary | ICD-10-CM

## 2018-03-27 MED ORDER — NA SULFATE-K SULFATE-MG SULF 17.5-3.13-1.6 GM/177ML PO SOLN: ORAL | 0 refills | Status: DC

## 2018-03-30 ENCOUNTER — Ambulatory Visit (INDEPENDENT_AMBULATORY_CARE_PROVIDER_SITE_OTHER): Payer: 59

## 2018-03-30 DIAGNOSIS — Z23 Encounter for immunization: Secondary | ICD-10-CM

## 2018-04-02 ENCOUNTER — Other Ambulatory Visit: Payer: Self-pay | Admitting: Family Medicine

## 2018-04-02 MED ORDER — BUTALBITAL-ACETAMINOPHEN 50-325 MG PO TABS: ORAL_TABLET | ORAL | 0 refills | Status: DC

## 2018-04-04 NOTE — Progress Notes (Signed)
NEUROLOGY CONSULTATION NOTE  Kimberly Blankenship MRN: [id number] DOB: 15-Mar-1986  Referring provider: Lamar Blinks, MD Primary care provider: Lamar Blinks, MD  Reason for consult:  migraine  HISTORY OF PRESENT ILLNESS: Kimberly Blankenship is a 32 year old right-handed female who presents for migraines.  History supplemented by referring providers note.  Onset:  All her life, but worse since 69-64 years old  Location:  Bifrontal and back of head, radiating into neck, shoulders and upper back Quality:  thobbing Intensity:  5-6/10.  She denies new headache, thunderclap headache or severe headache that wakes her from sleep. Aura:  no Prodrome:  no Postdrome:  no Associated symptoms:  Nausea, vomiting, photophobia, phonophobia.  She denies associated visual disturbance, autonomic symptoms, unilateral numbness or weakness. Duration:  30 to 60 minutes with Fioricet, if severe than needs to sleep it off Frequency:  4 days a week (severe 1 to 2 days a month) Frequency of abortive medication: Fioricet or Tylenol 4 days a week Triggers:  Sugar, if neck and shoulders a tight Relieving factors:  Stretching neck and shoulders, sleep, ice pack Activity:  Can't function if severe  Rescue protocol:  Tylenol if mild-moderate, Fioricet if severe Current NSAIDS:  Tylenol Current analgesics:  Acetaminophen-butalbital Current triptans:  no Current ergotamine:  no Current anti-emetic:  no Current muscle relaxants:  no Current anti-anxiolytic:  Alprazolam 0.25mg  PRN anxiety Current sleep aide:  no Current Antihypertensive medications:  no Current Antidepressant medications:  no Current Anticonvulsant medications:  no Current anti-CGRP:  no Current Vitamins/Herbal/Supplements:  B12, Magnesium 400mg  daily, zinc Current Antihistamines/Decongestants:  no Other therapy:  Stretching of neck and upper back Hormone/birth control:  None  Past NSAIDS:  Ibuprofen, naproxen Past analgesics:  Excedrin  (sometimes helps, not the severe) Past abortive triptans:  Maxalt 10mg  (made her feel worse) Past abortive ergotamine:  no Past muscle relaxants:  no Past anti-emetic:  None Past antihypertensive medications:  None Past antidepressant medications:  Sertraline 25mg  daily,fluoxetine 40mg  daily Past anticonvulsant medications: None Past anti-CGRP:  none Past vitamins/Herbal/Supplements:  none Past antihistamines/decongestants:  none Other past therapies:  none  Caffeine:  1 to 2 cups coffee daily, 1 cup tea a day.  Occasional cola Alcohol:  1 drink per week Smoker:  no Diet:  3 to 4 8oz water daily Exercise:  Not routine Depression:  some; Anxiety:  yes Other pain:  Neck and back Sleep hygiene:  Usually okay but not always feels rested when she wakes up. Family history of headache:  Brother, both parents  05/23/17 CMP:  Na 140, K 4.8, Cl 103, CO2 30, glucose 89, BUN 13, Cr 0.87, t bili 0.6, ALP 31, AST 18, ALT 19.  PAST MEDICAL HISTORY: Past Medical History:  Diagnosis Date  . Anxiety   . Chicken pox   . Depression   . GERD (gastroesophageal reflux disease)   . Hay fever   . Headache, migraine     PAST SURGICAL HISTORY: Past Surgical History:  Procedure Laterality Date  . wisdom teeth      MEDICATIONS: Current Outpatient Medications on File Prior to Visit  Medication Sig Dispense Refill  . ACETAMINOPHEN-BUTALBITAL 50-325 MG TABS May take 1-2 tablets every 4 hours as needed for headache. Max 6 per 24 hours 30 each 0  . ALPRAZolam (XANAX) 0.25 MG tablet Take 1 tablet (0.25 mg total) by mouth 2 (two) times daily as needed for anxiety. 30 tablet 1  . Ascorbic Acid (VITAMIN C) 1000 MG tablet Take 1,000 mg by  mouth daily.    . Cholecalciferol (VITAMIN D3) 5000 units CAPS Take 1 capsule by mouth daily.    Marland Kitchen dicyclomine (BENTYL) 10 MG capsule Take 1 capsule twice daily as needed for cramping and diarrhea. 60 capsule 4  . Ergocalciferol (VITAMIN D2) 2000 units TABS Take 1 tablet  by mouth daily.    . fluticasone (FLONASE) 50 MCG/ACT nasal spray Place 2 sprays into both nostrils daily. 16 g 2  . levocetirizine (XYZAL) 5 MG tablet Take 1 tablet (5 mg total) by mouth every evening. 30 tablet 2  . MAGNESIUM PO Take by mouth.    . Menaquinone-7 (VITAMIN K2) 100 MCG CAPS Take 1 tablet by mouth daily.    . Na Sulfate-K Sulfate-Mg Sulf 17.5-3.13-1.6 GM/177ML SOLN Follow instructions given to you by your doctor 2 Bottle 0  . vitamin B-12 (CYANOCOBALAMIN) 1000 MCG tablet Take 1,000 mcg by mouth daily.    Marland Kitchen zinc gluconate 50 MG tablet Take 50 mg by mouth daily.     No current facility-administered medications on file prior to visit.     ALLERGIES: Allergies  Allergen Reactions  . Sulfa Drugs Cross Reactors Rash    FAMILY HISTORY: Family History  Problem Relation Age of Onset  . Arthritis Mother   . Diabetes Mother   . Miscarriages / [place] Mother   . Fibroids Mother   . Depression Brother   . Arthritis Maternal Grandmother   . Fibroids Maternal Grandmother   . Hypertension Maternal Grandfather   . Stomach cancer Maternal Grandfather   . Arthritis Paternal Grandmother   . COPD Paternal Grandmother   . Other Paternal Grandfather        Brain tumor  . Uterine cancer Paternal Aunt   . Colon cancer Neg Hx   . Esophageal cancer Neg Hx   . Pancreatic cancer Neg Hx   . Liver disease Neg Hx    SOCIAL HISTORY: Social History   Socioeconomic History  . Marital status: Married    Spouse name: Not on file  . Number of children: 0  . Years of education: Not on file  . Highest education level: Not on file  Occupational History  . Occupation: admin assist  Social Needs  . Financial resource strain: Not on file  . Food insecurity:    Worry: Not on file    Inability: Not on file  . Transportation needs:    Medical: Not on file    Non-medical: Not on file  Tobacco Use  . Smoking status: Never Smoker  . Smokeless tobacco: Never Used  Substance and Sexual  Activity  . Alcohol use: Yes    Alcohol/week: 1.0 standard drinks    Types: 1 Standard drinks or equivalent per week    Comment: 1-2 once a week  . Drug use: No  . Sexual activity: Yes    Birth control/protection: Pill    Comment: viorelle  Lifestyle  . Physical activity:    Days per week: Not on file    Minutes per session: Not on file  . Stress: Not on file  Relationships  . Social connections:    Talks on phone: Not on file    Gets together: Not on file    Attends religious service: Not on file    Active member of club or organization: Not on file    Attends meetings of clubs or organizations: Not on file    Relationship status: Not on file  . Intimate partner violence:  Fear of current or ex partner: Not on file    Emotionally abused: Not on file    Physically abused: Not on file    Forced sexual activity: Not on file  Other Topics Concern  . Not on file  Social History Narrative  . Not on file    REVIEW OF SYSTEMS: Constitutional: No fevers, chills, or sweats, no generalized fatigue, change in appetite Eyes: No visual changes, double vision, eye pain Ear, nose and throat: No hearing loss, ear pain, nasal congestion, sore throat Cardiovascular: No chest pain, palpitations Respiratory:  No shortness of breath at rest or with exertion, wheezes GastrointestinaI: No nausea, vomiting, diarrhea, abdominal pain, fecal incontinence Genitourinary:  No dysuria, urinary retention or frequency Musculoskeletal:  No neck pain, back pain Integumentary: No rash, pruritus, skin lesions Neurological: as above Psychiatric: No depression, insomnia, anxiety Endocrine: No palpitations, fatigue, diaphoresis, mood swings, change in appetite, change in weight, increased thirst Hematologic/Lymphatic:  No purpura, petechiae. Allergic/Immunologic: no itchy/runny eyes, nasal congestion, recent allergic reactions, rashes  PHYSICAL EXAM: Blood pressure 108/74, pulse 95, height 5\' 7"  (1.702  m), weight 136 lb (61.7 kg), SpO2 99 %. General: No acute distress.  Patient appears well-groomed.  Head:  Normocephalic/atraumatic Eyes:  fundi examined but not visualized Neck: supple, no paraspinal tenderness, full range of motion Back: No paraspinal tenderness Heart: regular rate and rhythm Lungs: Clear to auscultation bilaterally. Vascular: No carotid bruits. Neurological Exam: Mental status: alert and oriented to person, place, and time, recent and remote memory intact, fund of knowledge intact, attention and concentration intact, speech fluent and not dysarthric, language intact. Cranial nerves: CN I: not tested CN II: pupils equal, round and reactive to light, visual fields intact CN III, IV, VI:  full range of motion, no nystagmus, no ptosis CN V: facial sensation intact CN VII: upper and lower face symmetric CN VIII: hearing intact CN IX, X: gag intact, uvula midline CN XI: sternocleidomastoid and trapezius muscles intact CN XII: tongue midline Bulk & Tone: normal, no fasciculations. Motor:  5/5 throughout  Sensation:  temperature and vibration sensation intact. Deep Tendon Reflexes:  2+ throughout, toes downgoing.  Finger to nose testing:  Without dysmetria.  Heel to shin:  Without dysmetria.  Gait:  Normal station and stride.  Able to turn and tandem walk. Romberg negative.  IMPRESSION: Chronic migraine, without status migrainosus, not intractable  PLAN: 1.  For preventative management, she wishes to try supplements: Magnesium citrate 400 mg daily, riboflavin 400 mg daily, and coenzyme every 10 100 mg 3 times daily. 2.  For abortive therapy, she will try sumatriptan 50 mg.  She may also use Tylenol.  Advised to stop Fioricet. 3.  Limit use of pain relievers to no more than 2 days out of week to prevent risk of rebound or medication-overuse headache. 4.  Keep headache diary 5.  Exercise, hydration, caffeine cessation, sleep hygiene, monitor for and avoid triggers 6.   Follow up in 3 months   Thank you for allowing me to take part in the care of this patient.  Metta Clines, DO  CC: Lamar Blinks, MD

## 2018-04-05 ENCOUNTER — Ambulatory Visit: Payer: 59 | Admitting: Neurology

## 2018-04-05 ENCOUNTER — Encounter: Payer: Self-pay | Admitting: Neurology

## 2018-04-05 VITALS — BP 108/74 | HR 95 | Ht 67.0 in | Wt 136.0 lb

## 2018-04-05 DIAGNOSIS — G43709 Chronic migraine without aura, not intractable, without status migrainosus: Secondary | ICD-10-CM

## 2018-04-05 MED ORDER — SUMATRIPTAN SUCCINATE 50 MG PO TABS: ORAL_TABLET | ORAL | 3 refills | Status: DC

## 2018-04-05 NOTE — Patient Instructions (Addendum)
Migraine Recommendations: 1.  We will hold off on a daily preventative 2.  Take sumatriptan 50mg  at earliest onset of headache.  May repeat dose once in 2 hours if needed.  Do not exceed two tablets in 24 hours. 3.  STOP FIORICET.  Limit use of pain relievers to no more than 2 days out of the week.  These medications include acetaminophen, ibuprofen, triptans and narcotics.  This will help reduce risk of rebound headaches. 4.  Be aware of common food triggers such as processed sweets, processed foods with nitrites (such as deli meat, hot dogs, sausages), foods with MSG, alcohol (such as wine), chocolate, certain cheeses, certain fruits (dried fruits, bananas, pineapple), vinegar, diet soda. 4.  Avoid caffeine 5.  Routine exercise 6.  Proper sleep hygiene 7.  Stay adequately hydrated with water 8.  Keep a headache diary. 9.  Maintain proper stress management. 10.  Do not skip meals. 11.  Consider supplements:  Magnesium citrate 400mg  to 600mg  daily, riboflavin 400mg , Coenzyme Q 10 100mg  three times daily 12.  Follow up in 3 months

## 2018-04-23 ENCOUNTER — Encounter: Payer: Self-pay | Admitting: Internal Medicine

## 2018-04-23 ENCOUNTER — Ambulatory Visit (AMBULATORY_SURGERY_CENTER): Payer: 59 | Admitting: Internal Medicine

## 2018-04-23 VITALS — BP 99/64 | HR 81 | Temp 97.8°F | Resp 13 | Ht 67.0 in | Wt 136.0 lb

## 2018-04-23 DIAGNOSIS — R197 Diarrhea, unspecified: Secondary | ICD-10-CM | POA: Diagnosis not present

## 2018-04-23 DIAGNOSIS — R140 Abdominal distension (gaseous): Secondary | ICD-10-CM | POA: Diagnosis not present

## 2018-04-23 DIAGNOSIS — R195 Other fecal abnormalities: Secondary | ICD-10-CM

## 2018-04-23 DIAGNOSIS — R1084 Generalized abdominal pain: Secondary | ICD-10-CM

## 2018-04-23 DIAGNOSIS — Z1211 Encounter for screening for malignant neoplasm of colon: Secondary | ICD-10-CM | POA: Diagnosis not present

## 2018-04-23 MED ORDER — SODIUM CHLORIDE 0.9 % IV SOLN: 500.0000 mL | Freq: Once | INTRAVENOUS | Status: DC

## 2018-04-23 NOTE — Progress Notes (Signed)
Alert and oriented x3, pleased with MAC, report to RN  

## 2018-04-23 NOTE — Op Note (Signed)
[facility] Patient Name: Kimberly Blankenship Procedure Date: 04/23/2018 8:50 AM MRN: [id number] Endoscopist: Jerene Bears , MD Age: 32 Referring MD:  Date of Birth: 29-Jun-1985 Gender: Female Account #: [id number] Procedure:                Colonoscopy Indications:              Clinically significant diarrhea of unexplained                            origin, Change in bowel habits, abdominal bloating,                            lower abdominal pain Medicines:                Monitored Anesthesia Care Procedure:                Pre-Anesthesia Assessment:                           - Prior to the procedure, a History and Physical                            was performed, and patient medications and                            allergies were reviewed. The patient's tolerance of                            previous anesthesia was also reviewed. The risks                            and benefits of the procedure and the sedation                            options and risks were discussed with the patient.                            All questions were answered, and informed consent                            was obtained. Prior Anticoagulants: The patient has                            taken no previous anticoagulant or antiplatelet                            agents. ASA Grade Assessment: II - A patient with                            mild systemic disease. After reviewing the risks                            and benefits, the patient was deemed in  satisfactory condition to undergo the procedure.                           After obtaining informed consent, the colonoscope                            was passed under direct vision. Throughout the                            procedure, the patient's blood pressure, pulse, and                            oxygen saturations were monitored continuously. The                            Colonoscope was introduced through the anus  and                            advanced to the terminal ileum. The colonoscopy was                            performed without difficulty. The patient tolerated                            the procedure well. The quality of the bowel                            preparation was good. The terminal ileum, ileocecal                            valve, appendiceal orifice, and rectum were                            photographed. Scope In: 9:16:07 AM Scope Out: 9:31:13 AM Scope Withdrawal Time: 0 hours 10 minutes 56 seconds  Total Procedure Duration: 0 hours 15 minutes 6 seconds  Findings:                 The terminal ileum appeared normal.                           Normal mucosa was found in the entire colon.                            Biopsies for histology were taken with a cold                            forceps from the right colon and left colon for                            evaluation of microscopic colitis.                           The retroflexed view of the distal rectum and anal  verge was normal and showed no anal or rectal                            abnormalities. Complications:            No immediate complications. Estimated Blood Loss:     Estimated blood loss was minimal. Impression:               - The examined portion of the ileum was normal.                           - Normal mucosa in the entire examined colon.                            Biopsied.                           - The distal rectum and anal verge are normal on                            retroflexion view. Recommendation:           - Patient has a contact number available for                            emergencies. The signs and symptoms of potential                            delayed complications were discussed with the                            patient. Return to normal activities tomorrow.                            Written discharge instructions were provided to the                             patient.                           - Resume previous diet.                           - Continue present medications.                           - Repeat colonoscopy at age 61 for screening                            purposes.                           - Await pathology results. If pathology results                            unrevealing would consider rifaximin 550 mg 3 times  daily x14 days for IBS-D. Jerene Bears, MD 04/23/2018 9:36:01 AM This report has been signed electronically.

## 2018-04-23 NOTE — Patient Instructions (Signed)
YOU HAD AN ENDOSCOPIC PROCEDURE TODAY AT THE [facility]:   Refer to the procedure report that was given to you for any specific questions about what was found during the examination.  If the procedure report does not answer your questions, please call your gastroenterologist to clarify.  If you requested that your care partner not be given the details of your procedure findings, then the procedure report has been included in a sealed envelope for you to review at your convenience later.  YOU SHOULD EXPECT: Some feelings of bloating in the abdomen. Passage of more gas than usual.  Walking can help get rid of the air that was put into your GI tract during the procedure and reduce the bloating. If you had a lower endoscopy (such as a colonoscopy or flexible sigmoidoscopy) you may notice spotting of blood in your stool or on the toilet paper. If you underwent a bowel prep for your procedure, you may not have a normal bowel movement for a few days.  Please Note:  You might notice some irritation and congestion in your nose or some drainage.  This is from the oxygen used during your procedure.  There is no need for concern and it should clear up in a day or so.  SYMPTOMS TO REPORT IMMEDIATELY:   Following lower endoscopy (colonoscopy or flexible sigmoidoscopy):  Excessive amounts of blood in the stool  Significant tenderness or worsening of abdominal pains  Swelling of the abdomen that is new, acute  Fever of 100F or higher  For urgent or emergent issues, a gastroenterologist can be reached at any hour by calling [phone].   DIET:  We do recommend a small meal at first, but then you may proceed to your regular diet.  Drink plenty of fluids but you should avoid alcoholic beverages for 24 hours.  ACTIVITY:  You should plan to take it easy for the rest of today and you should NOT DRIVE or use heavy machinery until tomorrow (because of the sedation medicines used during the test).     FOLLOW UP: Our staff will call the number listed on your records the next business day following your procedure to check on you and address any questions or concerns that you may have regarding the information given to you following your procedure. If we do not reach you, we will leave a message.  However, if you are feeling well and you are not experiencing any problems, there is no need to return our call.  We will assume that you have returned to your regular daily activities without incident.  If any biopsies were taken you will be contacted by phone or by letter within the next 1-3 weeks.  Please call us at [phone] if you have not heard about the biopsies in 3 weeks.    SIGNATURES/CONFIDENTIALITY: You and/or your care partner have signed paperwork which will be entered into your electronic medical record.  These signatures attest to the fact that that the information above on your After Visit Summary has been reviewed and is understood.  Full responsibility of the confidentiality of this discharge information lies with you and/or your care-partner. 

## 2018-04-23 NOTE — Progress Notes (Signed)
Called to room to assist during endoscopic procedure.  Patient ID and intended procedure confirmed with present staff. Received instructions for my participation in the procedure from the performing physician.  

## 2018-04-24 ENCOUNTER — Telehealth: Payer: Self-pay

## 2018-04-24 ENCOUNTER — Telehealth: Payer: Self-pay | Admitting: *Deleted

## 2018-04-24 NOTE — Telephone Encounter (Signed)
  Follow up Call-  Call back number 04/23/2018  Post procedure Call Back phone  # [phone]  Permission to leave phone message Yes  Some recent data might be hidden    [facility]

## 2018-04-24 NOTE — Telephone Encounter (Signed)
Left message on answering machine. 

## 2018-04-27 ENCOUNTER — Other Ambulatory Visit: Payer: Self-pay

## 2018-04-27 MED ORDER — RIFAXIMIN 550 MG PO TABS: 550.0000 mg | ORAL_TABLET | Freq: Three times a day (TID) | ORAL | 0 refills | Status: DC

## 2018-04-30 ENCOUNTER — Telehealth: Payer: Self-pay | Admitting: *Deleted

## 2018-04-30 ENCOUNTER — Encounter: Payer: Self-pay | Admitting: *Deleted

## 2018-04-30 MED FILL — XIFAXAN 550 MG TABLET: 550 | 14 days supply | Qty: 42 | Fill #0

## 2018-04-30 NOTE — Telephone Encounter (Signed)
Patient's xifaxan 550 mg has been approved until 05/14/18 through [employer]. [id number].

## 2018-05-01 ENCOUNTER — Ambulatory Visit: Admit: 2018-05-01 | Payer: 59 | Admitting: Obstetrics & Gynecology

## 2018-05-01 SURGERY — EXCISION, CYST, OVARY, LAPAROSCOPIC
Anesthesia: Choice

## 2018-05-23 DIAGNOSIS — D224 Melanocytic nevi of scalp and neck: Secondary | ICD-10-CM | POA: Diagnosis not present

## 2018-05-23 DIAGNOSIS — D2262 Melanocytic nevi of left upper limb, including shoulder: Secondary | ICD-10-CM | POA: Diagnosis not present

## 2018-05-23 DIAGNOSIS — D2261 Melanocytic nevi of right upper limb, including shoulder: Secondary | ICD-10-CM | POA: Diagnosis not present

## 2018-05-25 ENCOUNTER — Other Ambulatory Visit: Payer: Self-pay

## 2018-05-25 MED ORDER — PRUCALOPRIDE SUCCINATE 2 MG PO TABS: 2.0000 mg | ORAL_TABLET | Freq: Every day | ORAL | 3 refills | Status: DC

## 2018-05-25 MED FILL — TRETINOIN 0.025% CREAM: 0.025 | 15 days supply | Qty: 20 | Fill #0

## 2018-05-25 NOTE — Telephone Encounter (Signed)
Please begin Motegrity 2 mg daily  Please let the patient know that this medication does not have to be lifelong and there should be no risk of dependency She can certainly have trials off the medication whenever she wishes  Office follow-up in 2 to 3 months for continuity, after starting medication

## 2018-05-28 ENCOUNTER — Encounter: Payer: Self-pay | Admitting: Family Medicine

## 2018-05-28 DIAGNOSIS — Z1283 Encounter for screening for malignant neoplasm of skin: Secondary | ICD-10-CM | POA: Insufficient documentation

## 2018-06-01 ENCOUNTER — Telehealth: Payer: Self-pay | Admitting: *Deleted

## 2018-06-01 NOTE — Telephone Encounter (Signed)
Patient's Motegrity 2 mg has been denied by [employer]. Patient must first have tried and failed an over the counter medication for constipation (duration must be provided) AND Linzess prior to being approved for Motegrity.  [id number]  Dr Hilarie Fredrickson, shall I send script for Linzess instead?

## 2018-06-01 NOTE — Telephone Encounter (Signed)
Please let patient know Motegrity has been denied by her insurance company She must first try and fail Linzess 72 mcg daily This can be tried if she is willing

## 2018-06-04 MED ORDER — LINACLOTIDE 72 MCG PO CAPS: 72.0000 ug | ORAL_CAPSULE | Freq: Every day | ORAL | 1 refills | Status: DC

## 2018-06-04 NOTE — Telephone Encounter (Signed)
I have spoken to patient to advise that insurance wants her to first try and fail Linzess 72 mcg daily before approving Motegrity. Patient is agreeable to trying Linzess. Will send script to pharmacy and patient will call us if Linzess is not helpful for her constipation.

## 2018-06-07 MED FILL — LINZESS 72 MCG CAPSULE: 72 | 30 days supply | Qty: 30 | Fill #0

## 2018-06-08 DIAGNOSIS — D224 Melanocytic nevi of scalp and neck: Secondary | ICD-10-CM | POA: Diagnosis not present

## 2018-06-08 NOTE — Telephone Encounter (Signed)
Patient's Linzess has been approved through 06/07/19 per [employer].  [id number].

## 2018-06-12 DIAGNOSIS — N390 Urinary tract infection, site not specified: Secondary | ICD-10-CM | POA: Diagnosis not present

## 2018-07-02 ENCOUNTER — Telehealth: Payer: Self-pay

## 2018-07-02 NOTE — Telephone Encounter (Signed)
error 

## 2018-07-06 ENCOUNTER — Other Ambulatory Visit: Payer: Self-pay

## 2018-07-06 MED ORDER — PRUCALOPRIDE SUCCINATE 2 MG PO TABS: 2.0000 mg | ORAL_TABLET | Freq: Every day | ORAL | 3 refills | Status: DC

## 2018-07-06 NOTE — Telephone Encounter (Signed)
Please document and efficacy/side effects from Linzess and proceed with Motegrity 2 mg daily

## 2018-07-09 MED FILL — MOTEGRITY 2 MG TABS: 2 | 30 days supply | Qty: 30 | Fill #0

## 2018-07-09 NOTE — Progress Notes (Addendum)
[place] [facility] [address] [place], [place] [address] [phone]  Date:  07/12/2018   Name:  Kimberly Blankenship   DOB:  [birth year]   MRN:  [id number]  PCP:  Darreld Mclean, MD    Chief Complaint: Hernia (abdominal pain) and Fatigue ("brain fog" , couple months)   History of Present Illness:  Kimberly Blankenship is a 33 y.o. very pleasant female patient who presents with the following:  Generally healthy young woman who is here today with concern about umbilical hernia She has had this since infancy.  It had started to hurt a week or so ago; she was not sure if she pulled something or otherwise irritated it It hurt for 5 days or so No vomiting or severe pain.   Feeling better now.  She had wondered about having this electively repaired.  She may be having an ovarian cyst removed soon, and wondered if perhaps the umbilical hernia could be repaired at the same time However, she is not sure if she may wish to have another child at some point in the future  Her chief complaint also includes "fatigue and brain fog" She is feeling tired for about 3 months She is not sleeping that well some nights- however even if she does get a good night sleep she may still feel tired.  She endorses some elements of fatigue and also sleepiness  LMP is current - at least she thinks this is starting today She does not generally snore or seem to have sleep apnea -her husband has not mentioned anything like this  She notes that her anxiety is under good control, but she does have some sx of depression She has not needed xanax in some time She is somewhat overwhelmed as her husband has been dealing with some anxiety and depression himself   Her GI prescribed something called Motegrity for her IBS- she has not tried this yet   She does not have any suicidal ideation     Patient Active Problem List   Diagnosis Date Noted  . Skin cancer screening  05/28/2018  . Anemia 11/03/2017  . Metrorrhagia 11/01/2017  . Great toe pain, left 11/03/2016    Past Medical History:  Diagnosis Date  . Anxiety   . Chicken pox   . Depression   . GERD (gastroesophageal reflux disease)   . Hay fever   . Headache, migraine   . Irritable bowel syndrome with diarrhea     Past Surgical History:  Procedure Laterality Date  . wisdom teeth      Social History   Tobacco Use  . Smoking status: Never Smoker  . Smokeless tobacco: Never Used  Substance Use Topics  . Alcohol use: Yes    Alcohol/week: 1.0 standard drinks    Types: 1 Standard drinks or equivalent per week    Comment: 1-2 once a week  . Drug use: No    Family History  Problem Relation Age of Onset  . Arthritis Mother   . Diabetes Mother   . Miscarriages / [place] Mother   . Fibroids Mother   . Depression Brother   . Arthritis Maternal Grandmother   . Fibroids Maternal Grandmother   . Hypertension Maternal Grandfather   . Stomach cancer Maternal Grandfather   . Arthritis Paternal Grandmother   . COPD Paternal Grandmother   . Other Paternal Grandfather        Brain tumor  . Uterine  cancer Paternal Aunt   . Colon cancer Neg Hx   . Esophageal cancer Neg Hx   . Pancreatic cancer Neg Hx   . Liver disease Neg Hx     Allergies  Allergen Reactions  . Sulfa Antibiotics Itching and Rash  . Sulfa Drugs Cross Reactors Rash    Medication list has been reviewed and updated.  Current Outpatient Medications on File Prior to Visit  Medication Sig Dispense Refill  . ACETAMINOPHEN-BUTALBITAL 50-325 MG TABS May take 1-2 tablets every 4 hours as needed for headache. Max 6 per 24 hours 30 each 0  . ALPRAZolam (XANAX) 0.25 MG tablet Take 1 tablet (0.25 mg total) by mouth 2 (two) times daily as needed for anxiety. 30 tablet 1  . Ascorbic Acid (VITAMIN C) 1000 MG tablet Take 1,000 mg by mouth daily.    . Cholecalciferol (VITAMIN D3) 5000 units CAPS Take 1 capsule by mouth daily.     Marland Kitchen dicyclomine (BENTYL) 10 MG capsule Take 1 capsule twice daily as needed for cramping and diarrhea. 60 capsule 4  . Ergocalciferol (VITAMIN D2) 2000 units TABS Take 1 tablet by mouth daily.    Marland Kitchen levocetirizine (XYZAL) 5 MG tablet Take 1 tablet (5 mg total) by mouth every evening. 30 tablet 2  . linaclotide (LINZESS) 72 MCG capsule Take 1 capsule (72 mcg total) by mouth daily before breakfast. Pharmacy-please d/c rx for motegrity. Insurance will not cover at this time. 30 capsule 1  . MAGNESIUM PO Take by mouth.    . Menaquinone-7 (VITAMIN K2) 100 MCG CAPS Take 1 tablet by mouth daily.    . Prucalopride Succinate (MOTEGRITY) 2 MG TABS Take 2 mg by mouth daily. 30 tablet 3  . rifaximin (XIFAXAN) 550 MG TABS tablet Take 1 tablet (550 mg total) by mouth 3 (three) times daily. 42 tablet 0  . SUMAtriptan (IMITREX) 50 MG tablet Take 1 tablet earliest onset of migraine.  May repeat x1 in 2 hours if headache persists or recurs. 10 tablet 3  . vitamin B-12 (CYANOCOBALAMIN) 1000 MCG tablet Take 1,000 mcg by mouth daily.    Marland Kitchen zinc gluconate 50 MG tablet Take 50 mg by mouth daily.     No current facility-administered medications on file prior to visit.     Review of Systems:  As per HPI- otherwise negative. Wt Readings from Last 3 Encounters:  07/12/18 143 lb (64.9 kg)  04/23/18 136 lb (61.7 kg)  04/05/18 136 lb (61.7 kg)   She is still quite slim, but notes that she has gained a few pounds  No SI   Physical Examination: Vitals:   07/12/18 1711  BP: 108/70  Pulse: 64  Resp: 16  Temp: 98.5 F (36.9 C)  SpO2: 100%   Vitals:   07/12/18 1711  Weight: 143 lb (64.9 kg)  Height: 5\' 7"  (1.702 m)   Body mass index is 22.4 kg/m. Ideal Body Weight: Weight in (lb) to have BMI = 25: 159.3  GEN: WDWN, NAD, Non-toxic, A & O x 3, normal weight, looks well  HEENT: Atraumatic, Normocephalic. Neck supple. No masses, No LAD.  Bilateral TM wnl, oropharynx normal.  PEERL,EOMI.   Ears and Nose: No  external deformity. CV: RRR, No M/G/R. No JVD. No thrill. No extra heart sounds. PULM: CTA B, no wheezes, crackles, rhonchi. No retractions. No resp. distress. No accessory muscle use. ABD: S, NT, ND, +BS. No rebound. No HSM.  Small umbilical hernia is present.  It is slightly tender to  press on, but patient states this is baseline. EXTR: No c/c/e NEURO Normal gait.  PSYCH: Normally interactive. Conversant. Not depressed or anxious appearing.  Calm demeanor.   Results for orders placed or performed in visit on 07/12/18  POCT urine pregnancy  Result Value Ref Range   Preg Test, Ur Negative Negative    Assessment and Plan: Other fatigue - Plan: Vitamin D (25 hydroxy), TSH, B12, CBC, Comprehensive metabolic panel, POCT urine pregnancy  Umbilical hernia without obstruction and without gangrene - Plan: Ambulatory referral to General Surgery  Here today with a couple of concerns.  Kimberly Blankenship has had an umbilical hernia since childhood.  There was a episode last week where it hurt for a few days, though this has now resolved.  She has been thinking about having elective repair, and would like to consult with general surgery. This is fine, I have advised her that surgery may suggest waiting until she is sure her family is complete prior to repairing this hernia  Kimberly Blankenship has also felt fatigued.  Admits she is under some stress as her husband has been suffering from anxiety and depression.  She has some sleepiness, but has not been noted to have behaviors typical of sleep apnea such as snoring or pauses in breathing while asleep.  Will obtain labs for as above, if all normal she might be interested in trying Wellbutrin  Signed Lamar Blinks, MD  Received labs, 1/24  Results for orders placed or performed in visit on 07/12/18  Vitamin D (25 hydroxy)  Result Value Ref Range   VITD 37.84 30.00 - 100.00 ng/mL  TSH  Result Value Ref Range   TSH 1.28 0.35 - 4.50 uIU/mL  B12  Result Value Ref  Range   Vitamin B-12 327 211 - 911 pg/mL  CBC  Result Value Ref Range   WBC 7.9 4.0 - 10.5 K/uL   RBC 4.65 3.87 - 5.11 Mil/uL   Platelets 179.0 150.0 - 400.0 K/uL   Hemoglobin 14.0 12.0 - 15.0 g/dL   HCT 41.8 36.0 - 46.0 %   MCV 90.0 78.0 - 100.0 fl   MCHC 33.4 30.0 - 36.0 g/dL   RDW 12.4 11.5 - 15.5 %  Comprehensive metabolic panel  Result Value Ref Range   Sodium 140 135 - 145 mEq/L   Potassium 4.9 3.5 - 5.1 mEq/L   Chloride 104 96 - 112 mEq/L   CO2 29 19 - 32 mEq/L   Glucose, Bld 95 70 - 99 mg/dL   BUN 12 6 - 23 mg/dL   Creatinine, Ser 0.90 0.40 - 1.20 mg/dL   Total Bilirubin 0.3 0.2 - 1.2 mg/dL   Alkaline Phosphatase 42 39 - 117 U/L   AST 18 0 - 37 U/L   ALT 16 0 - 35 U/L   Total Protein 7.0 6.0 - 8.3 g/dL   Albumin 4.6 3.5 - 5.2 g/dL   Calcium 9.3 8.4 - 10.5 mg/dL   GFR 73.33 >60.00 mL/min  POCT urine pregnancy  Result Value Ref Range   Preg Test, Ur Negative Negative   Message to patient

## 2018-07-10 ENCOUNTER — Telehealth: Payer: Self-pay | Admitting: *Deleted

## 2018-07-10 NOTE — Telephone Encounter (Signed)
Patient's Motegrity 2 mg has been approved through [employer] through 07/07/2019. [id number]

## 2018-07-12 ENCOUNTER — Encounter: Payer: Self-pay | Admitting: Family Medicine

## 2018-07-12 ENCOUNTER — Ambulatory Visit: Payer: 59 | Admitting: Family Medicine

## 2018-07-12 VITALS — BP 108/70 | HR 64 | Temp 98.5°F | Resp 16 | Ht 67.0 in | Wt 143.0 lb

## 2018-07-12 DIAGNOSIS — K429 Umbilical hernia without obstruction or gangrene: Secondary | ICD-10-CM

## 2018-07-12 DIAGNOSIS — R5383 Other fatigue: Secondary | ICD-10-CM

## 2018-07-12 LAB — POCT URINE PREGNANCY: Preg Test, Ur: NEGATIVE

## 2018-07-12 NOTE — Patient Instructions (Signed)
I will be in touch with your labs ASAP.  If everything looks normal, we might consider a medication like Wellbutrin for depression symptoms  I am referring you to general surgery to look at your umbilical hernia.  They may suggest that you have this repaired, or might recommend waiting until you are sure you do not wish to have another child. In any case, if you have any severe or unusual pain from this hernia please do seek care at the ER.  You can first try laying on your back and gently pushing on any bulge as it may go back in

## 2018-07-13 ENCOUNTER — Encounter: Payer: Self-pay | Admitting: Family Medicine

## 2018-07-13 LAB — COMPREHENSIVE METABOLIC PANEL
ALT: 16 U/L (ref 0–35)
AST: 18 U/L (ref 0–37)
Albumin: 4.6 g/dL (ref 3.5–5.2)
Alkaline Phosphatase: 42 U/L (ref 39–117)
BUN: 12 mg/dL (ref 6–23)
CO2: 29 mEq/L (ref 19–32)
Calcium: 9.3 mg/dL (ref 8.4–10.5)
Chloride: 104 mEq/L (ref 96–112)
Creatinine, Ser: 0.9 mg/dL (ref 0.40–1.20)
GFR: 73.33 mL/min (ref >60.00)
Glucose, Bld: 95 mg/dL (ref 70–99)
Potassium: 4.9 mEq/L (ref 3.5–5.1)
Sodium: 140 mEq/L (ref 135–145)
TOTAL PROTEIN: 7 g/dL (ref 6.0–8.3)
Total Bilirubin: 0.3 mg/dL (ref 0.2–1.2)

## 2018-07-13 LAB — CBC
HCT: 41.8 % (ref 36.0–46.0)
Hemoglobin: 14 g/dL (ref 12.0–15.0)
MCHC: 33.4 g/dL (ref 30.0–36.0)
MCV: 90 fl (ref 78.0–100.0)
Platelets: 179 10*3/uL (ref 150.0–400.0)
RBC: 4.65 Mil/uL (ref 3.87–5.11)
RDW: 12.4 % (ref 11.5–15.5)
WBC: 7.9 10*3/uL (ref 4.0–10.5)

## 2018-07-13 LAB — VITAMIN D 25 HYDROXY (VIT D DEFICIENCY, FRACTURES): VITD: 37.84 ng/mL (ref 30.00–100.00)

## 2018-07-13 LAB — VITAMIN B12: VITAMIN B 12: 327 pg/mL (ref 211–911)

## 2018-07-13 LAB — TSH: TSH: 1.28 u[IU]/mL (ref 0.35–4.50)

## 2018-07-25 ENCOUNTER — Ambulatory Visit: Payer: 59 | Admitting: Obstetrics & Gynecology

## 2018-07-25 ENCOUNTER — Encounter: Payer: Self-pay | Admitting: Obstetrics & Gynecology

## 2018-07-25 VITALS — BP 124/59 | HR 82 | Ht 67.0 in | Wt 145.0 lb

## 2018-07-25 DIAGNOSIS — D271 Benign neoplasm of left ovary: Secondary | ICD-10-CM

## 2018-07-25 NOTE — Progress Notes (Signed)
Surgery consult. Kathrene Alu RN

## 2018-07-25 NOTE — Patient Instructions (Signed)
Ovarian Cyst         An ovarian cyst is a fluid-filled sac that forms on an ovary. The ovaries are small organs that produce eggs in women. Various types of cysts can form on the ovaries. Some may cause symptoms and require treatment. Most ovarian cysts go away on their own, are not cancerous (are benign), and do not cause problems.  Common types of ovarian cysts include:  · Functional (follicle) cysts.  ? Occur during the menstrual cycle, and usually go away with the next menstrual cycle if you do not get pregnant.  ? Usually cause no symptoms.  · Endometriomas.  ? Are cysts that form from the tissue that lines the uterus (endometrium).  ? Are sometimes called “chocolate cysts” because they become filled with blood that turns brown.  ? Can cause pain in the lower abdomen during intercourse and during your period.  · Cystadenoma cysts.  ? Develop from cells on the outside surface of the ovary.  ? Can get very large and cause lower abdomen pain and pain with intercourse.  ? Can cause severe pain if they twist or break open (rupture).  · Dermoid cysts.  ? Are sometimes found in both ovaries.  ? May contain different kinds of body tissue, such as skin, teeth, hair, or cartilage.  ? Usually do not cause symptoms unless they get very big.  · Theca lutein cysts.  ? Occur when too much of a certain hormone (human chorionic gonadotropin) is produced and overstimulates the ovaries to produce an egg.  ? Are most common after having procedures used to assist with the conception of a baby (in vitro fertilization).  What are the causes?  Ovarian cysts may be caused by:  · Ovarian hyperstimulation syndrome. This is a condition that can develop from taking fertility medicines. It causes multiple large ovarian cysts to form.  · Polycystic ovarian syndrome (PCOS). This is a common hormonal disorder that can cause ovarian cysts, as well as problems with your period or fertility.  What increases the risk?  The following factors may  make you more likely to develop ovarian cysts:  · Being overweight or obese.  · Taking fertility medicines.  · Taking certain forms of hormonal birth control.  · Smoking.  What are the signs or symptoms?  Many ovarian cysts do not cause symptoms. If symptoms are present, they may include:  · Pelvic pain or pressure.  · Pain in the lower abdomen.  · Pain during sex.  · Abdominal swelling.  · Abnormal menstrual periods.  · Increasing pain with menstrual periods.  How is this diagnosed?  These cysts are commonly found during a routine pelvic exam. You may have tests to find out more about the cyst, such as:  · Ultrasound.  · X-ray of the pelvis.  · CT scan.  · MRI.  · Blood tests.  How is this treated?  Many ovarian cysts go away on their own without treatment. Your health care provider may want to check your cyst regularly for 2-3 months to see if it changes. If you are in menopause, it is especially important to have your cyst monitored closely because menopausal women have a higher rate of ovarian cancer.  When treatment is needed, it may include:  · Medicines to help relieve pain.  · A procedure to drain the cyst (aspiration).  · Surgery to remove the whole cyst.  · Hormone treatment or birth control pills. These methods are sometimes used   to help dissolve a cyst.  Follow these instructions at home:  · Take over-the-counter and prescription medicines only as told by your health care provider.  · Do not drive or use heavy machinery while taking prescription pain medicine.  · Get regular pelvic exams and Pap tests as often as told by your health care provider.  · Return to your normal activities as told by your health care provider. Ask your health care provider what activities are safe for you.  · Do not use any products that contain nicotine or tobacco, such as cigarettes and e-cigarettes. If you need help quitting, ask your health care provider.  · Keep all follow-up visits as told by your health care provider.  This is important.  Contact a health care provider if:  · Your periods are late, irregular, or painful, or they stop.  · You have pelvic pain that does not go away.  · You have pressure on your bladder or trouble emptying your bladder completely.  · You have pain during sex.  · You have any of the following in your abdomen:  ? A feeling of fullness.  ? Pressure.  ? Discomfort.  ? Pain that does not go away.  ? Swelling.  · You feel generally ill.  · You become constipated.  · You lose your appetite.  · You develop severe acne.  · You start to have more body hair and facial hair.  · You are gaining weight or losing weight without changing your exercise and eating habits.  · You think you may be pregnant.  Get help right away if:  · You have abdominal pain that is severe or gets worse.  · You cannot eat or drink without vomiting.  · You suddenly develop a fever.  · Your menstrual period is much heavier than usual.  This information is not intended to replace advice given to you by your health care provider. Make sure you discuss any questions you have with your health care provider.  Document Released: 06/06/2005 Document Revised: 12/25/2015 Document Reviewed: 11/08/2015  Elsevier Interactive Patient Education © 2019 [employer]

## 2018-07-25 NOTE — Progress Notes (Signed)
History:  33 y.o. G0P0 here today for consult for ovarian cystectomy. Pt was prev eval for AUB. She was noted on [place] to have a dermoid cyst on left side. Pt was counseled to have the lesion removed.  Pt reports some PMS sx with breast tenderness and some light spotting. Her cycles have normalized. She denies pain in the pelvic. She has cramps with her cycles for the first 2-3 days relieved with OTC NSAIDS this is not new. She has no other sx of pain.    The following portions of the patient's history were reviewed and updated as appropriate: allergies, current medications, past family history, past medical history, past social history, past surgical history and problem list.  Review of Systems:  Pertinent items are noted in HPI.    Objective:  Physical Exam Blood pressure (!) 124/59, pulse 82, height 5\' 7"  (1.702 m), weight 145 lb (65.8 kg).  CONSTITUTIONAL: Well-developed, well-nourished female in no acute distress.  HENT:  Normocephalic, atraumatic EYES: Conjunctivae and EOM are normal. No scleral icterus.  NECK: Normal range of motion SKIN: Skin is warm and dry. No rash noted. Not diaphoretic.No pallor. [place]: Alert and oriented to person, place, and time. Normal coordination.  Pelvic: deferred  Labs and Imaging 02/15/2018 CLINICAL DATA:  Irregular menses.  Left ovarian mass.  EXAM: ULTRASOUND PELVIS TRANSVAGINAL  TECHNIQUE: Transvaginal ultrasound examination of the pelvis was performed including evaluation of the uterus, ovaries, adnexal regions, and pelvic cul-de-sac.  COMPARISON:  11/07/2017  FINDINGS: Uterus  Measurements: 7.6 x 4.3 x 4.8 cm. No fibroids or other mass visualized.  Endometrium  Thickness: 7 mm. Normal tri layered appearance. No focal abnormality visualized.  Right ovary  Measurements: 4.4 x 2.5 x 3.4 cm. Normal appearance/no adnexal mass.  Left ovary  Measurements: 5.3 x 2.9 x 3.1 cm. A 2 cm simple follicular cyst is noted. A  solid hyperechoic mass is also seen which measures 2.8 x 2.4 x 2.2 cm. This is not significantly changed since previous study is consistent with a benign ovarian dermoid.  Other findings:  No abnormal free fluid  IMPRESSION: No significant change in 2.8 cm benign left ovarian dermoid.  Otherwise unremarkable exam  Assessment & Plan:  Ovarian cyst- asymptomatic dermoid  I have reviewed with pt the small risk of ovarian torsion vs the risks of surgery for this small cyst that appears to be a Dermoid. after reviewing the risks of removal vs observation at present. She is aware that she will in all likelihood need surgery for this cyst in the future. She was told to follow if she develops acute pelvic pain or other sx.  All questions were answered.    F/u in 6 months or sooner prn   Total face-to-face time with patient was 20 min.  Greater than 50% was spent in counseling and coordination of care with the patient.   Patrick Sohm L. Harraway-Smith, M.D., [facility]

## 2018-07-31 ENCOUNTER — Encounter: Payer: Self-pay | Admitting: Family Medicine

## 2018-07-31 DIAGNOSIS — F410 Panic disorder [episodic paroxysmal anxiety] without agoraphobia: Secondary | ICD-10-CM

## 2018-07-31 DIAGNOSIS — F411 Generalized anxiety disorder: Secondary | ICD-10-CM

## 2018-07-31 MED ORDER — ALPRAZOLAM 0.25 MG PO TABS: 0.2500 mg | ORAL_TABLET | Freq: Two times a day (BID) | ORAL | 1 refills | Status: DC | PRN

## 2018-07-31 MED FILL — ALPRAZolam 0.25 MG TABS: 0.25 | 15 days supply | Qty: 30 | Fill #0

## 2018-07-31 NOTE — Progress Notes (Deleted)
NEUROLOGY FOLLOW UP OFFICE NOTE  Kimberly Blankenship [id number]  HISTORY OF PRESENT ILLNESS: Kimberly Blankenship is a 33 year old right-handed woman who follows up for migraines.  UPDATE: Intensity:  *** Duration:  *** Frequency:  *** Frequency of abortive medication: *** Rescue protocol:  Tylenol if mild-moderate, Fioricet if severe Current NSAIDS:  Tylenol Current analgesics:  Acetaminophen-butalbital Current triptans:   Sumatriptan 50 mg Current ergotamine:  no Current anti-emetic:  no Current muscle relaxants:  no Current anti-anxiolytic:  Alprazolam 0.25mg  PRN anxiety Current sleep aide:  no Current Antihypertensive medications:  no Current Antidepressant medications:  no Current Anticonvulsant medications:  no Current anti-CGRP:  no Current Vitamins/Herbal/Supplements:  B12, Magnesium 400mg  daily, riboflavin 400 mg daily, co-Q10 100 mg 3 times daily, zinc Current Antihistamines/Decongestants:  no Other therapy:  Stretching of neck and upper back Hormone/birth control:  None  Caffeine:  1 to 2 cups coffee daily, 1 cup tea a day.  Occasional cola Alcohol:  1 drink per week Smoker:  no Diet:  3 to 4 8oz water daily Exercise:  Not routine Depression:  some; Anxiety:  yes Other pain:  Neck and back Sleep hygiene:  Usually okay but not always feels rested when she wakes up.  HISTORY:  Onset: All her life, but worse since 24 or 33 years old Location:  Bifrontal and back of head, radiating into neck, shoulders and upper back Quality:  thobbing Initial intensity:  5-6/10.  She denies new headache, thunderclap headache or severe headache that wakes her from sleep. Aura:  no Prodrome:  no Postdrome:  no Associated symptoms: Nausea, vomiting, photophobia, phonophobia.  She denies associated visual disturbance, autonomic symptoms, unilateral numbness or weakness. Initial duration:  30 to 60 minutes with Fioricet, if severe than needs to sleep it off Initial Frequency:  4 days a week  (severe 1 to 2 days a month) Initial Frequency of abortive medication: Fioricet or Tylenol 4 days a week Rigors: Sugar, if neck and shoulders a tight Relieving factors: Stretching neck and shoulders, sleep, ice pack Activity:  Can't function if severe  Past NSAIDS:  Ibuprofen, naproxen Past analgesics:  Excedrin (sometimes helps, not the severe) Past abortive triptans:  Maxalt 10mg  (made her feel worse) Past abortive ergotamine:  no Past muscle relaxants:  no Past anti-emetic:  None Past antihypertensive medications:  None Past antidepressant medications:  Sertraline 25mg  daily,fluoxetine 40mg  daily Past anticonvulsant medications: None Past anti-CGRP:  none Past vitamins/Herbal/Supplements:  none Past antihistamines/decongestants:  none Other past therapies:  none  Family history of headache:  Brother, both parents  PAST MEDICAL HISTORY: Past Medical History:  Diagnosis Date  . Anxiety   . Chicken pox   . Depression   . GERD (gastroesophageal reflux disease)   . Hay fever   . Headache, migraine   . Irritable bowel syndrome with diarrhea     MEDICATIONS: Current Outpatient Medications on File Prior to Visit  Medication Sig Dispense Refill  . ACETAMINOPHEN-BUTALBITAL 50-325 MG TABS May take 1-2 tablets every 4 hours as needed for headache. Max 6 per 24 hours 30 each 0  . ALPRAZolam (XANAX) 0.25 MG tablet Take 1 tablet (0.25 mg total) by mouth 2 (two) times daily as needed for anxiety. 30 tablet 1  . Ascorbic Acid (VITAMIN C) 1000 MG tablet Take 1,000 mg by mouth daily.    . Cholecalciferol (VITAMIN D3) 5000 units CAPS Take 1 capsule by mouth daily.    Marland Kitchen dicyclomine (BENTYL) 10 MG capsule Take 1 capsule twice daily as  needed for cramping and diarrhea. 60 capsule 4  . Ergocalciferol (VITAMIN D2) 2000 units TABS Take 1 tablet by mouth daily.    Marland Kitchen levocetirizine (XYZAL) 5 MG tablet Take 1 tablet (5 mg total) by mouth every evening. 30 tablet 2  . linaclotide (LINZESS) 72 MCG  capsule Take 1 capsule (72 mcg total) by mouth daily before breakfast. Pharmacy-please d/c rx for motegrity. Insurance will not cover at this time. 30 capsule 1  . MAGNESIUM PO Take by mouth.    . Menaquinone-7 (VITAMIN K2) 100 MCG CAPS Take 1 tablet by mouth daily.    . Prucalopride Succinate (MOTEGRITY) 2 MG TABS Take 2 mg by mouth daily. 30 tablet 3  . rifaximin (XIFAXAN) 550 MG TABS tablet Take 1 tablet (550 mg total) by mouth 3 (three) times daily. 42 tablet 0  . SUMAtriptan (IMITREX) 50 MG tablet Take 1 tablet earliest onset of migraine.  May repeat x1 in 2 hours if headache persists or recurs. 10 tablet 3  . vitamin B-12 (CYANOCOBALAMIN) 1000 MCG tablet Take 1,000 mcg by mouth daily.    Marland Kitchen zinc gluconate 50 MG tablet Take 50 mg by mouth daily.     No current facility-administered medications on file prior to visit.     ALLERGIES: Allergies  Allergen Reactions  . Sulfa Antibiotics Itching and Rash  . Sulfa Drugs Cross Reactors Rash    FAMILY HISTORY: Family History  Problem Relation Age of Onset  . Arthritis Mother   . Diabetes Mother   . Miscarriages / [place] Mother   . Fibroids Mother   . Depression Brother   . Arthritis Maternal Grandmother   . Fibroids Maternal Grandmother   . Hypertension Maternal Grandfather   . Stomach cancer Maternal Grandfather   . Arthritis Paternal Grandmother   . COPD Paternal Grandmother   . Other Paternal Grandfather        Brain tumor  . Uterine cancer Paternal Aunt   . Colon cancer Neg Hx   . Esophageal cancer Neg Hx   . Pancreatic cancer Neg Hx   . Liver disease Neg Hx    ***.  SOCIAL HISTORY: Social History   Socioeconomic History  . Marital status: Married    Spouse name: Shanon Brow  . Number of children: 0  . Years of education: Not on file  . Highest education level: Associate degree: academic program  Occupational History  . Occupation: admin assist  Social Needs  . Financial resource strain: Not on file  . Food  insecurity:    Worry: Not on file    Inability: Not on file  . Transportation needs:    Medical: Not on file    Non-medical: Not on file  Tobacco Use  . Smoking status: Never Smoker  . Smokeless tobacco: Never Used  Substance and Sexual Activity  . Alcohol use: Yes    Alcohol/week: 1.0 standard drinks    Types: 1 Standard drinks or equivalent per week    Comment: 1-2 once a week  . Drug use: No  . Sexual activity: Yes    Birth control/protection: Pill    Comment: viorelle  Lifestyle  . Physical activity:    Days per week: Not on file    Minutes per session: Not on file  . Stress: Not on file  Relationships  . Social connections:    Talks on phone: Not on file    Gets together: Not on file    Attends religious service: Not on file  Active member of club or organization: Not on file    Attends meetings of clubs or organizations: Not on file    Relationship status: Not on file  . Intimate partner violence:    Fear of current or ex partner: Not on file    Emotionally abused: Not on file    Physically abused: Not on file    Forced sexual activity: Not on file  Other Topics Concern  . Not on file  Social History Narrative   Patient is right-handed. She livew with her husband in a 2 story home. She drinks 1-2 cups of coffee and 1 cup of tea a day. She does not exercise.     REVIEW OF SYSTEMS: Constitutional: No fevers, chills, or sweats, no generalized fatigue, change in appetite Eyes: No visual changes, double vision, eye pain Ear, nose and throat: No hearing loss, ear pain, nasal congestion, sore throat Cardiovascular: No chest pain, palpitations Respiratory:  No shortness of breath at rest or with exertion, wheezes GastrointestinaI: No nausea, vomiting, diarrhea, abdominal pain, fecal incontinence Genitourinary:  No dysuria, urinary retention or frequency Musculoskeletal:  No neck pain, back pain Integumentary: No rash, pruritus, skin lesions Neurological: as  above Psychiatric: No depression, insomnia, anxiety Endocrine: No palpitations, fatigue, diaphoresis, mood swings, change in appetite, change in weight, increased thirst Hematologic/Lymphatic:  No purpura, petechiae. Allergic/Immunologic: no itchy/runny eyes, nasal congestion, recent allergic reactions, rashes  PHYSICAL EXAM: *** General: No acute distress.  Patient appears ***-groomed.  *** body habitus. Head:  Normocephalic/atraumatic Eyes:  Fundi examined but not visualized Neck: supple, no paraspinal tenderness, full range of motion Heart:  Regular rate and rhythm Lungs:  Clear to auscultation bilaterally Back: No paraspinal tenderness Neurological Exam: alert and oriented to person, place, and time. Attention span and concentration intact, recent and remote memory intact, fund of knowledge intact.  Speech fluent and not dysarthric, language intact.  CN II-XII intact. Bulk and tone normal, muscle strength 5/5 throughout.  Sensation to light touch, temperature and vibration intact.  Deep tendon reflexes 2+ throughout, toes downgoing.  Finger to nose and heel to shin testing intact.  Gait normal, Romberg negative.  IMPRESSION: ***  PLAN: ***  Metta Clines, DO  CC: ***

## 2018-08-02 ENCOUNTER — Ambulatory Visit: Payer: 59 | Admitting: Neurology

## 2018-08-16 DIAGNOSIS — K429 Umbilical hernia without obstruction or gangrene: Secondary | ICD-10-CM | POA: Diagnosis not present

## 2018-08-28 IMAGING — US US PELVIS COMPLETE TRANSABD/TRANSVAG
1 series · 14 of 25 positions shown · non-contrast
Comparison: None

CLINICAL DATA: Menometrorrhagia x 4-5 months

EXAM:
TRANSABDOMINAL AND TRANSVAGINAL ULTRASOUND OF PELVIS
TECHNIQUE: Both transabdominal and transvaginal ultrasound examinations of the
pelvis were performed. Transabdominal technique was performed for
global imaging of the pelvis including uterus, ovaries, adnexal
regions, and pelvic cul-de-sac. It was necessary to proceed with
endovaginal exam following the transabdominal exam to visualize the
left ovary.

[Series 1: us pelvis complete transabd/transvag · 0.21mm/px · 14 of 52 slices shown]
[im 1/52]
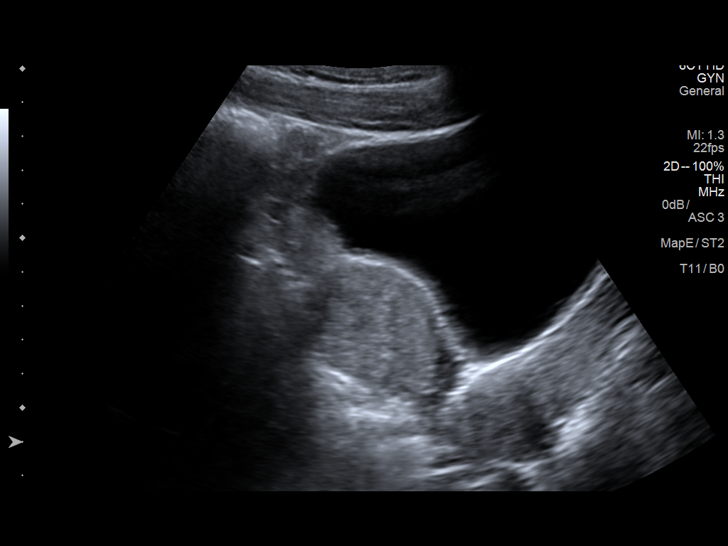
[im 5/52]
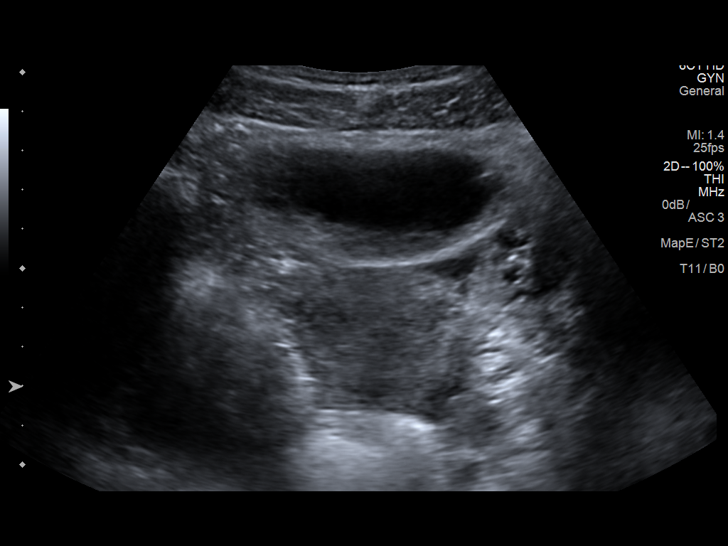
[im 9/52]
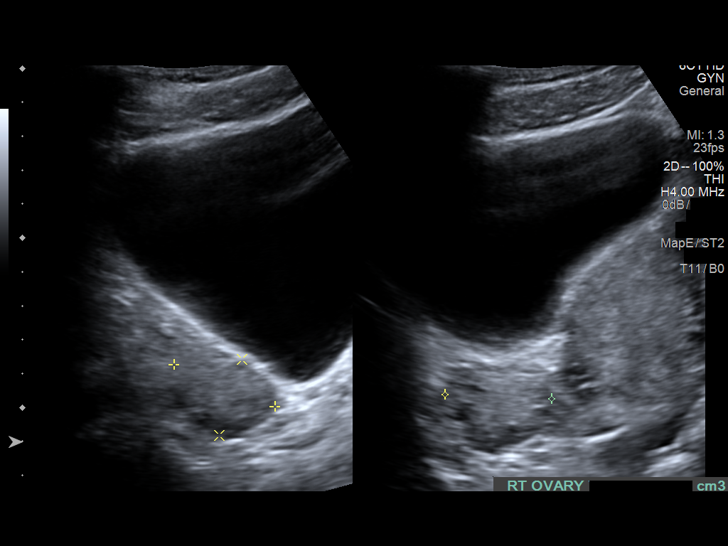
[im 13/52]
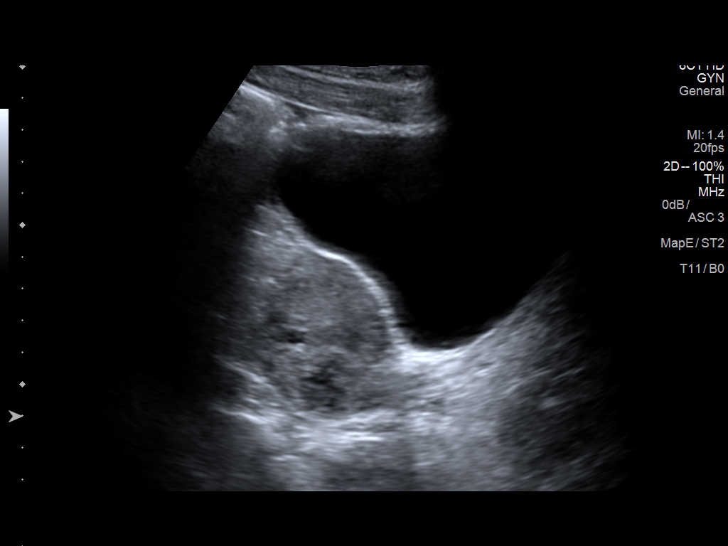
[im 18/52]
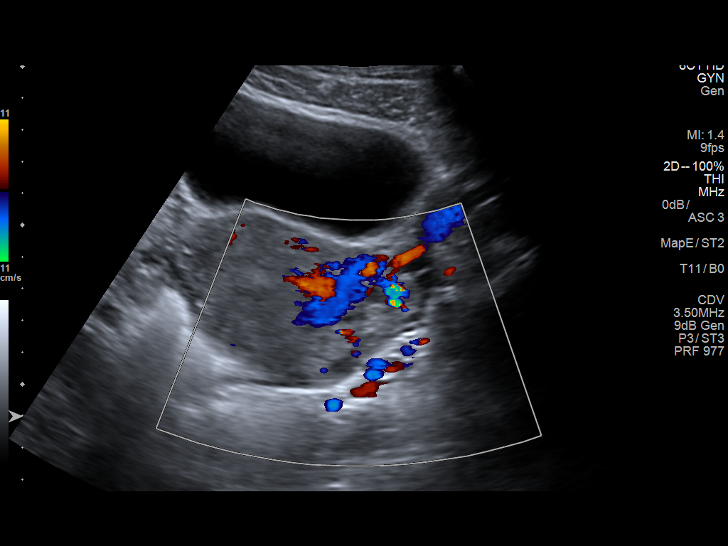
[im 20/52]
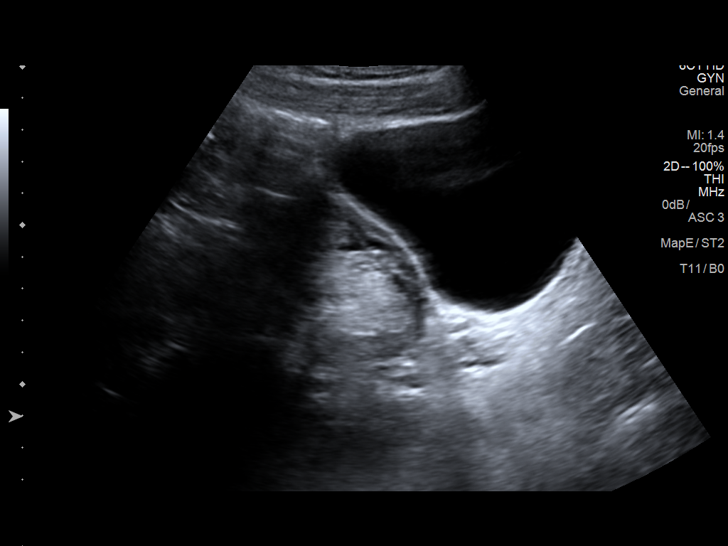
[im 24/52]
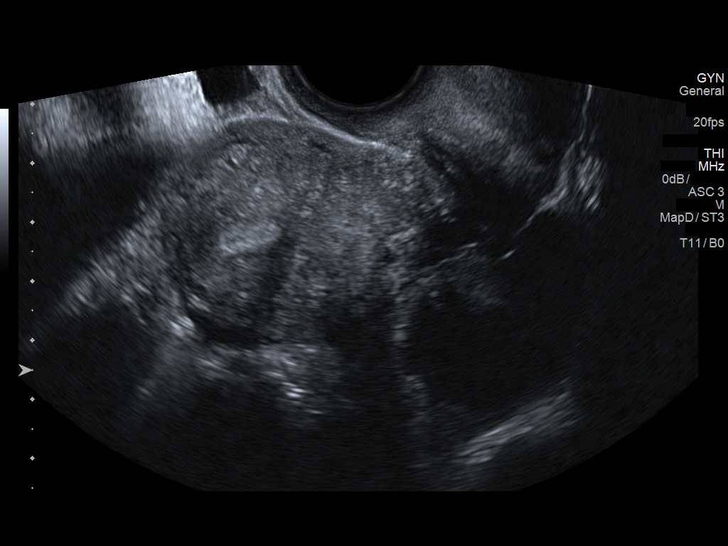
[im 28/52]
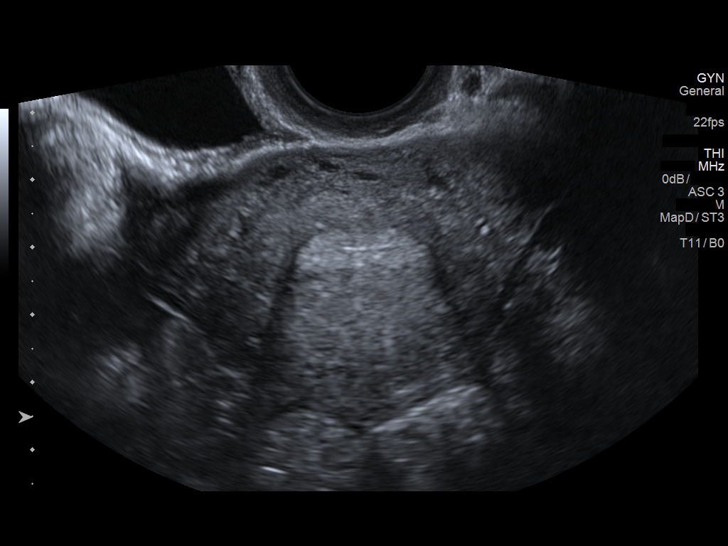
[im 32/52]
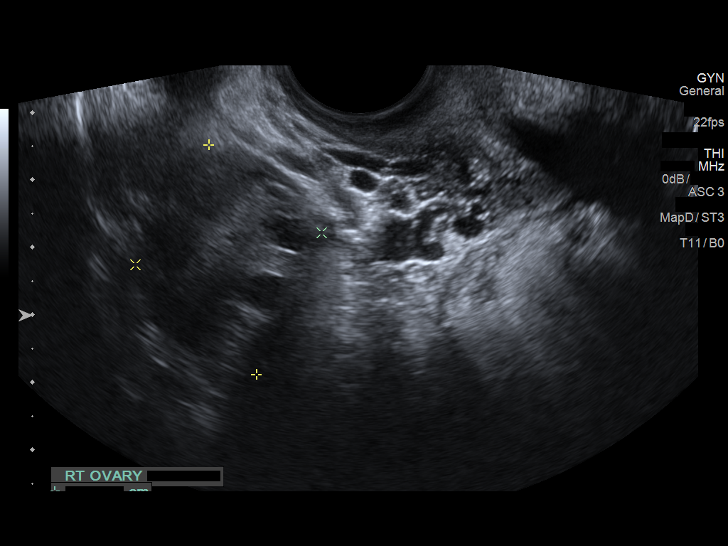
[im 35/52]
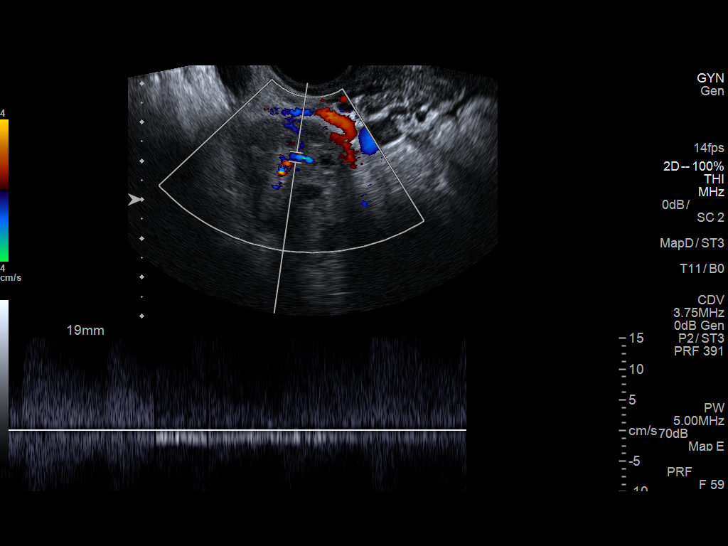
[im 39/52]
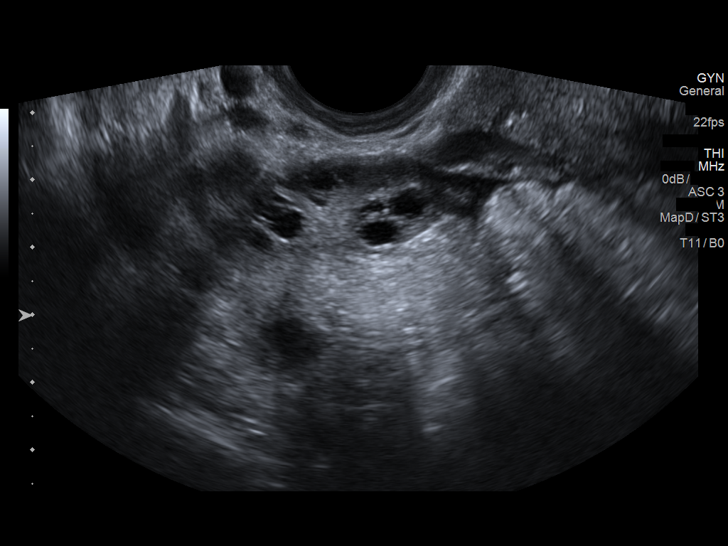
[im 43/52]
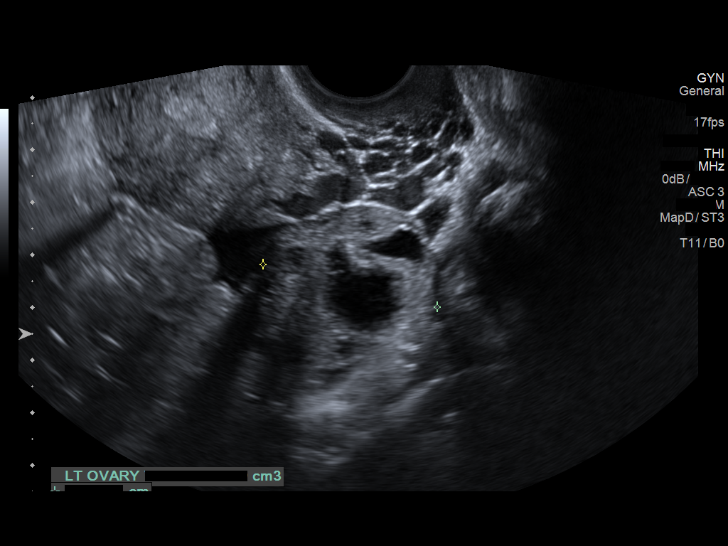
[im 47/52]
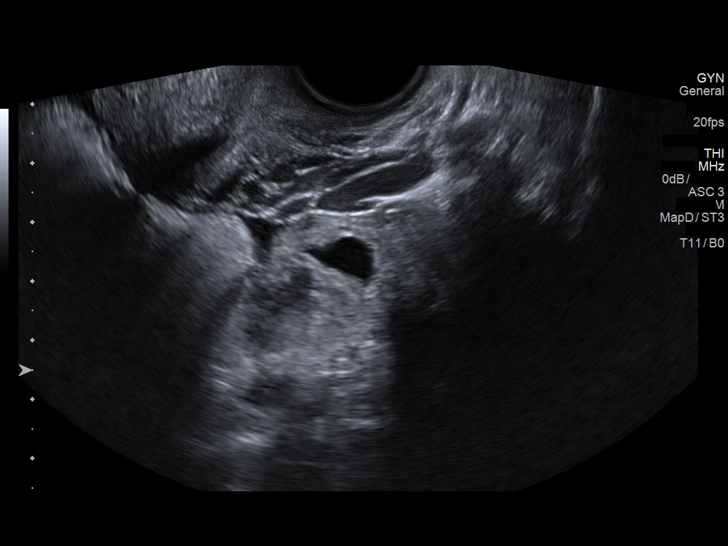
[im 52/52]
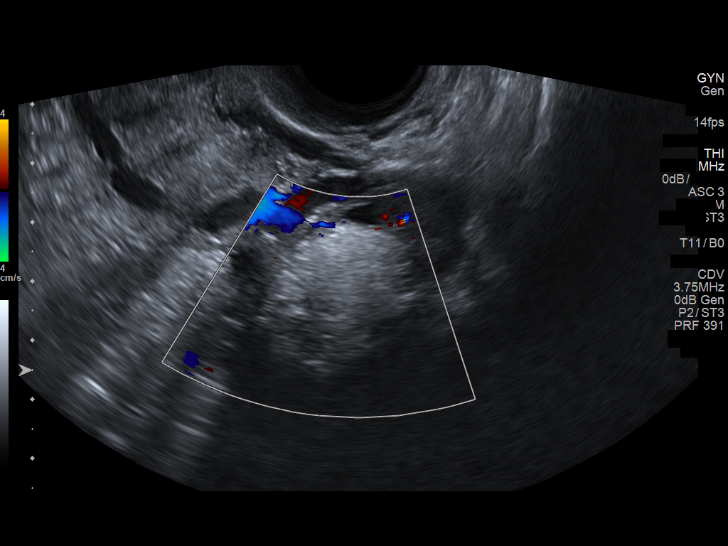

[14 of 25 positions shown; findings below may reference images not displayed]

FINDINGS: Uterus

Measurements: 6.4 x 4.1 x 5.2 cm. No fibroids or other mass
visualized.

Endometrium

Thickness: 7 mm.  No focal abnormality visualized.

Right ovary

Measurements: 3.5 x 2.8 x 3.2 cm. Normal appearance/no adnexal mass.

Left ovary

Measurements: 5.1 x 2.8 x 3.4 cm. 2.6 x 2.4 x 2.5 cm mass with
echogenic dirty shadowing appearance, suggesting a benign ovarian
dermoid. Additional 2.2 cm complex cystic lesion.

Other findings

No abnormal free fluid.
IMPRESSION: 2.6 cm left ovarian mass, favoring a benign ovarian dermoid.

Endometrial complex measures 7 mm, within normal limits.

## 2018-12-24 ENCOUNTER — Other Ambulatory Visit: Payer: Self-pay

## 2018-12-26 ENCOUNTER — Other Ambulatory Visit: Payer: Self-pay

## 2018-12-26 ENCOUNTER — Ambulatory Visit: Payer: 59 | Admitting: Podiatry

## 2018-12-26 ENCOUNTER — Ambulatory Visit (INDEPENDENT_AMBULATORY_CARE_PROVIDER_SITE_OTHER): Payer: 59

## 2018-12-26 DIAGNOSIS — G43909 Migraine, unspecified, not intractable, without status migrainosus: Secondary | ICD-10-CM | POA: Insufficient documentation

## 2018-12-26 DIAGNOSIS — M778 Other enthesopathies, not elsewhere classified: Secondary | ICD-10-CM

## 2018-12-26 DIAGNOSIS — M779 Enthesopathy, unspecified: Secondary | ICD-10-CM | POA: Diagnosis not present

## 2018-12-26 MED ORDER — MELOXICAM 15 MG PO TABS: 15.0000 mg | ORAL_TABLET | Freq: Every day | ORAL | 1 refills | Status: DC

## 2018-12-26 MED FILL — TRETINOIN 0.025% CREAM: 0.025 | 15 days supply | Qty: 20 | Fill #1

## 2018-12-26 MED FILL — MELOXICAM 15 MG TABLET: 15 | 30 days supply | Qty: 30 | Fill #0

## 2018-12-27 NOTE — Progress Notes (Addendum)
[facility] [address], [place], [place] [address] [phone]  Date:  12/31/2018   Name:  Kimberly Blankenship   DOB:  04/28/1986   MRN:  [id number]  PCP:  Darreld Mclean, MD    Chief Complaint: Alopecia (Pt reports hair loss sincxe february. )   History of Present Illness:  Kimberly Blankenship is a 33 y.o. very pleasant female patient who presents with the following:  Here today with concern of hair loss I last saw her in January for concern of "fatigue and brain fog"- we did a lab work up which was unrevealing.  She is feeling mostly better in this regard, still does have some issues with sleep which may cause fatigue  Over the last several months she has been working at home part of the time  She has not gotten sick at all during the pandemic  She is concerned about thinning hair- no patches, just seems thinner overall She has noticed this since about February.  She will sometimes pull out some hairs with bulbs still attached Never had this in the past   No recent stressors or pregnancy -except for the pandemic.  Admits that she has been more stressed with all these changes and worries  No current hormonal contraception  Married to [place] She does not smoke, rarely uses alcohol  Patient Active Problem List   Diagnosis Date Noted  . Migraine 12/26/2018  . Skin cancer screening 05/28/2018  . Anemia 11/03/2017  . Metrorrhagia 11/01/2017  . GERD (gastroesophageal reflux disease) 04/29/2017  . Abnormality of immune system 03/24/2017  . Low testosterone level in female 03/24/2017  . Panic attacks 02/01/2017  . B12 deficiency 01/30/2017  . Low blood sugar 01/18/2017  . Great toe pain, left 11/03/2016  . Fatigue 06/20/2016  . Allergy to cockroaches 12/30/2015  . GAD (generalized anxiety disorder) 02/18/2014  . Chronic neck pain 01/23/2014  . Vitamin D deficiency 01/23/2014  . Oral contraceptive use 12/25/2012  .  Allergic rhinitis 06/22/2012  . Chronic tension headaches 06/22/2012    Past Medical History:  Diagnosis Date  . Anxiety   . Chicken pox   . Depression   . GERD (gastroesophageal reflux disease)   . Hay fever   . Headache, migraine   . Irritable bowel syndrome with diarrhea     Past Surgical History:  Procedure Laterality Date  . wisdom teeth      Social History   Tobacco Use  . Smoking status: Never Smoker  . Smokeless tobacco: Never Used  Substance Use Topics  . Alcohol use: Yes    Alcohol/week: 1.0 standard drinks    Types: 1 Standard drinks or equivalent per week    Comment: 1-2 once a week  . Drug use: No    Family History  Problem Relation Age of Onset  . Arthritis Mother   . Diabetes Mother   . Miscarriages / [place] Mother   . Fibroids Mother   . Depression Brother   . Arthritis Maternal Grandmother   . Fibroids Maternal Grandmother   . Hypertension Maternal Grandfather   . Stomach cancer Maternal Grandfather   . Arthritis Paternal Grandmother   . COPD Paternal Grandmother   . Other Paternal Grandfather        Brain tumor  . Uterine cancer Paternal Aunt   . Colon cancer Neg Hx   . Esophageal cancer Neg Hx   . Pancreatic cancer Neg Hx   .  Liver disease Neg Hx     Allergies  Allergen Reactions  . Sulfa Antibiotics Itching and Rash  . Sulfa Drugs Cross Reactors Rash    Medication list has been reviewed and updated.  Current Outpatient Medications on File Prior to Visit  Medication Sig Dispense Refill  . ACETAMINOPHEN-BUTALBITAL 50-325 MG TABS May take 1-2 tablets every 4 hours as needed for headache. Max 6 per 24 hours 30 each 0  . ALPRAZolam (XANAX) 0.25 MG tablet Take 1 tablet (0.25 mg total) by mouth 2 (two) times daily as needed for anxiety. 30 tablet 1  . Cholecalciferol (VITAMIN D3) 5000 units CAPS Take 1 capsule by mouth daily.    Marland Kitchen levocetirizine (XYZAL) 5 MG tablet Take 1 tablet (5 mg total) by mouth every evening. 30 tablet 2  .  meloxicam (MOBIC) 15 MG tablet Take 1 tablet (15 mg total) by mouth daily. 30 tablet 1  . Menaquinone-7 (VITAMIN K2) 100 MCG CAPS Take 1 tablet by mouth daily.    . Omega-3 Fatty Acids (FISH OIL) 1000 MG CAPS Take 1 capsule by mouth daily.    . vitamin B-12 (CYANOCOBALAMIN) 1000 MCG tablet Take 1,000 mcg by mouth daily.    Marland Kitchen tretinoin (RETIN-A) 0.025 % cream Apply 1 application topically daily.     No current facility-administered medications on file prior to visit.     Review of Systems:  As per HPI- otherwise negative. No fever or chills No major weight change  Physical Examination: Vitals:   12/31/18 0924  BP: 100/77  Pulse: (!) 55  Resp: 16  Temp: 98.4 F (36.9 C)  SpO2: 99%   Vitals:   12/31/18 0924  Weight: 149 lb 6.4 oz (67.8 kg)  Height: 5\' 7"  (1.702 m)   Body mass index is 23.4 kg/m. Ideal Body Weight: Weight in (lb) to have BMI = 25: 159.3  GEN: WDWN, NAD, Non-toxic, A & O x 3, normal weight, looks well HEENT: Atraumatic, Normocephalic. Neck supple. No masses, No LAD.  TM within normal limits bilaterally Eyebrows appear normal Her hair is slightly thin on the anterior portion of the scalp.  No distinct bald spots.  Am able to get several hairs with bulbs attached on hair pull test, patient did shampoo her hair this morning Ears and Nose: No external deformity. CV: RRR, No M/G/R. No JVD. No thrill. No extra heart sounds. PULM: CTA B, no wheezes, crackles, rhonchi. No retractions. No resp. distress. No accessory muscle use. EXTR: No c/c/e NEURO Normal gait.  PSYCH: Normally interactive. Conversant. Not depressed or anxious appearing.  Calm demeanor.   BP Readings from Last 3 Encounters:  12/31/18 100/77  07/25/18 (!) 124/59  07/12/18 108/70   Pulse Readings from Last 3 Encounters:  12/31/18 (!) 55  07/25/18 82  07/12/18 64   Wt Readings from Last 3 Encounters:  12/31/18 149 lb 6.4 oz (67.8 kg)  07/25/18 145 lb (65.8 kg)  07/12/18 143 lb (64.9 kg)     Assessment and Plan:   ICD-10-CM   1. Hair loss  L65.9 TSH    Ferritin    Testosterone    DHEA-sulfate   Here today with concern of generalized hair thinning.  Suspect she has telogen effluvium due to recent pandemic.  Labs pending as above, I will be in touch with these results.  If all normal, I am glad to refer her to dermatology if she would like.  At this point she states that if labs are normal she will  likely give things a few more months to resolve, will let me know if she would like a Derm referral.  I did suggest that she could try Rogaine if she likes   Follow-up: No follow-ups on file.  No orders of the defined types were placed in this encounter.  Orders Placed This Encounter  Procedures  . TSH  . Ferritin  . Testosterone  . DHEA-sulfate      Signed Lamar Blinks, MD  Received her labs, message to pt  Results for orders placed or performed in visit on 12/31/18  TSH  Result Value Ref Range   TSH 1.96 0.35 - 4.50 uIU/mL  Ferritin  Result Value Ref Range   Ferritin 15.6 10.0 - 291.0 ng/mL  Testosterone  Result Value Ref Range   Testosterone 68.72 (H) 15.00 - 40.00 ng/dL

## 2018-12-31 ENCOUNTER — Ambulatory Visit: Payer: 59 | Admitting: Family Medicine

## 2018-12-31 ENCOUNTER — Encounter: Payer: Self-pay | Admitting: Family Medicine

## 2018-12-31 ENCOUNTER — Other Ambulatory Visit: Payer: Self-pay

## 2018-12-31 VITALS — BP 100/77 | HR 75 | Temp 98.4°F | Resp 16 | Ht 67.0 in | Wt 149.4 lb

## 2018-12-31 DIAGNOSIS — R7989 Other specified abnormal findings of blood chemistry: Secondary | ICD-10-CM | POA: Diagnosis not present

## 2018-12-31 DIAGNOSIS — L659 Nonscarring hair loss, unspecified: Secondary | ICD-10-CM | POA: Diagnosis not present

## 2018-12-31 LAB — TSH: TSH: 1.96 u[IU]/mL (ref 0.35–4.50)

## 2018-12-31 LAB — TESTOSTERONE: Testosterone: 68.72 ng/dL — ABNORMAL HIGH (ref 15.00–40.00)

## 2018-12-31 LAB — FERRITIN: Ferritin: 15.6 ng/mL (ref 10.0–291.0)

## 2018-12-31 NOTE — Patient Instructions (Signed)
Great to see you again today, but I am sorry that you are troubled by thinning hair.  I will be in touch with your labs asap If all is normal, we can have you consult with dermatology if you like The most likely issue however is telogen effluvium, or stress related hair loss- thankfully this is temporary  If it does not seem to resolve by itself in 2-3 months Rogaine OTC can be helpful

## 2018-12-31 NOTE — Progress Notes (Signed)
   HPI: 33 y.o. female presenting today with a chief complaint of pain noted to the right third toe that began about two weeks ago. She reports associated swelling of the toe. She states the pain initially began when she bent it the wrong way while doing yoga. Bending the toe now increases the pain. She was seen at an urgent care for treatment with no significant relief. Patient is here for further evaluation and treatment.   Past Medical History:  Diagnosis Date  . Anxiety   . Chicken pox   . Depression   . GERD (gastroesophageal reflux disease)   . Hay fever   . Headache, migraine   . Irritable bowel syndrome with diarrhea      Physical Exam: General: The patient is alert and oriented x3 in no acute distress.  Dermatology: Skin is warm, dry and supple bilateral lower extremities. Negative for open lesions or macerations.  Vascular: Palpable pedal pulses bilaterally. No edema or erythema noted. Capillary refill within normal limits.  Neurological: Epicritic and protective threshold grossly intact bilaterally.   Musculoskeletal Exam: Range of motion within normal limits to all pedal and ankle joints bilateral. Muscle strength 5/5 in all groups bilateral.   Radiographic Exam:  Normal osseous mineralization. Joint spaces preserved. No fracture/dislocation/boney destruction.    Assessment: 1. Capsulitis right third toe - improving   Plan of Care:  1. Patient evaluated. X-Rays reviewed.  2. Prescription for Meloxicam provided to patient. 3. Recommended good shoe gear.  4. Return to clinic as needed.      Edrick Kins, DPM [facility]  Dr. Edrick Kins, DPM    [address], [place] [address]                Office [phone]  Fax [phone]

## 2018-12-31 NOTE — Addendum Note (Signed)
Addended by: Lamar Blinks C on: 12/31/2018 07:42 PM   Modules accepted: Orders

## 2019-01-01 LAB — DHEA-SULFATE: DHEA-SO4: 185 ug/dL (ref 18–391)

## 2019-01-07 ENCOUNTER — Encounter: Payer: Self-pay | Admitting: Internal Medicine

## 2019-01-07 ENCOUNTER — Other Ambulatory Visit: Payer: Self-pay

## 2019-01-07 ENCOUNTER — Ambulatory Visit: Payer: 59 | Admitting: Internal Medicine

## 2019-01-07 VITALS — BP 118/72 | HR 80 | Temp 98.2°F | Ht 67.0 in | Wt 150.6 lb

## 2019-01-07 DIAGNOSIS — D271 Benign neoplasm of left ovary: Secondary | ICD-10-CM | POA: Diagnosis not present

## 2019-01-07 DIAGNOSIS — L659 Nonscarring hair loss, unspecified: Secondary | ICD-10-CM

## 2019-01-07 DIAGNOSIS — R7989 Other specified abnormal findings of blood chemistry: Secondary | ICD-10-CM | POA: Diagnosis not present

## 2019-01-07 NOTE — Patient Instructions (Signed)
-   Please stop by the lab sometime this week, around 8 AM, please make sure you are fasting.

## 2019-01-07 NOTE — Progress Notes (Signed)
Name: Kimberly Blankenship  MRN/ DOB: [id number], 01-22-1986    Age/ Sex: 33 y.o., female    PCP: Copland, Gay Filler, MD   Reason for Endocrinology Evaluation: Elevated testosterone      Date of Initial Endocrinology Evaluation: 01/07/2019     HPI: [name] is a 33 y.o. female with a past medical history of Migraine headaches, GERD and anxiety . The patient presented for initial endocrinology clinic visit on 01/07/2019 for consultative assistance with her elevated testosterone .   The  patient indicates that she was first diagnosed with elevated Testosterone in 12/2018, at which time she was having hair loss that has been ongoing since 07/2018. Work-up for this issue has thus far have included biochemical testing showing elevated testosterone. She has also been diagnosed with a dermoid cyst in 2019 that is being followed by GYN .  The patient has menstrual irregularities such as spotting. She started menarche around age 42 y.o with menstrual cycles occuring every 27 days.  The patient has clinical symptoms of hyperandrogenism such as increased acne that started ~ 2 yrs ago, currently on tretinoin cream. She denies hirsutism, hair loss is all over the body (just the scalp). She denies clinical features of virilization such as deepening of the voice or clitoromegaly. The patient has noted some weight gain, experienced in the past 1 yr, denies dyslipidemia, nonalcoholic steatohepatitis. She denies clinical features of Cushing's such as purple striae, central obesity, supraclavicular fullness, easy bruisability. She denies clinical features of OSA such as interrupted sleep patterns with apneic episodes, daytime somnolence, but husband states sometime she snores.   Lastly, the patient has Anxiety disorder and takes Xanax once a week. Denies eating disorders. She denies have family history of PCOS, adrenal cancer. Paternal aunt with ovarian cancer.     Denies any decrease in libido. Uses condom as  a form of contraception  No breast discharge.     HISTORY:  Past Medical History:  Past Medical History:  Diagnosis Date  . Anxiety   . Chicken pox   . Depression   . GERD (gastroesophageal reflux disease)   . Hay fever   . Headache, migraine   . Irritable bowel syndrome with diarrhea     Past Surgical History:  Past Surgical History:  Procedure Laterality Date  . wisdom teeth        Social History:  reports that she has never smoked. She has never used smokeless tobacco. She reports current alcohol use of about 1.0 standard drinks of alcohol per week. She reports that she does not use drugs.  Family History: family history includes Arthritis in her maternal grandmother, mother, and paternal grandmother; COPD in her paternal grandmother; Depression in her brother; Diabetes in her mother; Fibroids in her maternal grandmother and mother; Hypertension in her maternal grandfather; Miscarriages / [place] in her mother; Other in her paternal grandfather; Stomach cancer in her maternal grandfather; Uterine cancer in her paternal aunt.   HOME MEDICATIONS: Allergies as of 01/07/2019      Reactions   Sulfa Antibiotics Itching, Rash   Sulfa Drugs Cross Reactors Rash      Medication List       Accurate as of January 07, 2019  9:46 AM. If you have any questions, ask your nurse or doctor.        ACETAMINOPHEN-BUTALBITAL 50-325 MG Tabs May take 1-2 tablets every 4 hours as needed for headache. Max 6 per 24 hours   ALPRAZolam 0.25 MG tablet  Commonly known as: XANAX Take 1 tablet (0.25 mg total) by mouth 2 (two) times daily as needed for anxiety.   Fish Oil 1000 MG Caps Take 1 capsule by mouth daily.   levocetirizine 5 MG tablet Commonly known as: XYZAL Take 1 tablet (5 mg total) by mouth every evening.   meloxicam 15 MG tablet Commonly known as: MOBIC Take 1 tablet (15 mg total) by mouth daily.   tretinoin 0.025 % cream Commonly known as: RETIN-A Apply 1 application  topically daily.   vitamin B-12 1000 MCG tablet Commonly known as: CYANOCOBALAMIN Take 1,000 mcg by mouth daily.   Vitamin D3 125 MCG (5000 UT) Caps Take 1 capsule by mouth daily.   Vitamin K2 100 MCG Caps Take 1 tablet by mouth daily.         REVIEW OF SYSTEMS: A comprehensive ROS was conducted with the patient and is negative except as per HPI and below:  Review of Systems  Constitutional: Positive for malaise/fatigue. Negative for weight loss.  HENT: Negative for congestion and sore throat.   Eyes: Negative for blurred vision and pain.  Respiratory: Negative for cough and shortness of breath.   Cardiovascular: Negative for chest pain and palpitations.  Gastrointestinal: Negative for diarrhea and nausea.  Genitourinary: Negative for frequency.  Skin:       Hair thinning   Neurological: Negative for tingling and headaches.  Endo/Heme/Allergies: Negative for polydipsia.  Psychiatric/Behavioral: Negative for depression. The patient is nervous/anxious.        OBJECTIVE:  VS: BP 118/72 (BP Location: Left Arm, Patient Position: Sitting, Cuff Size: Normal)   Pulse 80   Temp 98.2 F (36.8 C)   Ht 5\' 7"  (1.702 m)   Wt 150 lb 9.6 oz (68.3 kg)   LMP 12/24/2018   SpO2 97%   BMI 23.59 kg/m    Wt Readings from Last 3 Encounters:  01/07/19 150 lb 9.6 oz (68.3 kg)  12/31/18 149 lb 6.4 oz (67.8 kg)  07/25/18 145 lb (65.8 kg)     EXAM: General: Pt appears well and is in NAD  Hydration: Well-hydrated with moist mucous membranes and good skin turgor  Eyes: External eye exam normal without stare, lid lag or exophthalmos.  EOM intact.  PERRL.  Ears, Nose, Throat: Hearing: Grossly intact bilaterally Dental: Good dentition  Throat: Clear without mass, erythema or exudate  Neck: General: Supple without adenopathy. Thyroid: Thyroid size normal.  No goiter or nodules appreciated. No thyroid bruit.  Lungs: Clear with good BS bilat with no rales, rhonchi, or wheezes  Heart:  Auscultation: RRR.  Abdomen: Normoactive bowel sounds, soft, nontender, without masses or organomegaly palpable  Extremities:  BL LE: No pretibial edema normal ROM and strength.  Skin: Hair: Texture and amount normal with gender appropriate distribution Skin Inspection: No rashes, acanthosis nigricans/skin tags. No lipohypertrophy Skin Palpation: Skin temperature, texture, and thickness normal to palpation  Neuro: Cranial nerves: II - XII grossly intact  Cerebellar: Normal coordination and movement; no tremor Motor: Normal strength throughout DTRs: 2+ and symmetric in UE without delay in relaxation phase  Mental Status: Judgment, insight: Intact Orientation: Oriented to time, place, and person Memory: Intact for recent and remote events Mood and affect: No depression, anxiety, or agitation     DATA REVIEWED:   Results for KHALIA, GONG (MRN [id number]) as of 01/07/2019 09:46  Ref. Range 12/31/2018 09:55  DHEA-SO4 Latest Ref Range: 18 - 391 mcg/dL 185  Testosterone Latest Ref Range: 15.00 - 40.00 ng/dL 68.72 (  H)  TSH Latest Ref Range: 0.35 - 4.50 uIU/mL 1.96   Results for KHIARA, SHUPING (MRN [id number]) as of 01/07/2019 09:46  Ref. Range 07/12/2018 17:44  Sodium Latest Ref Range: 135 - 145 mEq/L 140  Potassium Latest Ref Range: 3.5 - 5.1 mEq/L 4.9  Chloride Latest Ref Range: 96 - 112 mEq/L 104  CO2 Latest Ref Range: 19 - 32 mEq/L 29  Glucose Latest Ref Range: 70 - 99 mg/dL 95  BUN Latest Ref Range: 6 - 23 mg/dL 12  Creatinine Latest Ref Range: 0.40 - 1.20 mg/dL 0.90  Calcium Latest Ref Range: 8.4 - 10.5 mg/dL 9.3  Alkaline Phosphatase Latest Ref Range: 39 - 117 U/L 42  Albumin Latest Ref Range: 3.5 - 5.2 g/dL 4.6  AST Latest Ref Range: 0 - 37 U/L 18  ALT Latest Ref Range: 0 - 35 U/L 16  Total Protein Latest Ref Range: 6.0 - 8.3 g/dL 7.0  Total Bilirubin Latest Ref Range: 0.2 - 1.2 mg/dL 0.3  GFR Latest Ref Range: >60.00 mL/min 73.33   ASSESSMENT/PLAN/RECOMMENDATIONS:   1.  Elevated Testosterone :  - Pt is exhibiting some androgenic features with cystic acne, and hair thinning in the setting of mildly elevated testotserone.  - DHEAS is normal which excludes adrenal tumour - She has no clinical features of cushing syndrome -  Will repeat testosterone levels to include FSH, LH, prolactin, and 17-OH progesterone - If testosterone remains elevated, will have to consider the left ovarian dermoid cyst as a culprit, even though ovarian tumors typically cause a testosterone level of > 150 with more androgenic features, but this is something will have to keep in mind.  - Pt expressed understanding of the plan.   F/u in 3 months   Signed electronically by: Mack Guise, MD  [facility] End[facility]  [facility] [address] [place], [place] [address] Phone: [phone] FAX: [phone]   CC: Darreld Mclean, [address] [place] [address] Phone: [phone] Fax: [phone]   Return to Endocrinology clinic as below: No future appointments.

## 2019-01-08 ENCOUNTER — Other Ambulatory Visit: Payer: Self-pay

## 2019-01-08 ENCOUNTER — Encounter: Payer: Self-pay | Admitting: Internal Medicine

## 2019-01-08 ENCOUNTER — Other Ambulatory Visit (INDEPENDENT_AMBULATORY_CARE_PROVIDER_SITE_OTHER): Payer: 59

## 2019-01-08 DIAGNOSIS — R7989 Other specified abnormal findings of blood chemistry: Secondary | ICD-10-CM | POA: Insufficient documentation

## 2019-01-08 DIAGNOSIS — D271 Benign neoplasm of left ovary: Secondary | ICD-10-CM | POA: Insufficient documentation

## 2019-01-08 DIAGNOSIS — L659 Nonscarring hair loss, unspecified: Secondary | ICD-10-CM | POA: Insufficient documentation

## 2019-01-08 LAB — BASIC METABOLIC PANEL
BUN: 14 mg/dL (ref 6–23)
CO2: 27 mEq/L (ref 19–32)
Calcium: 9.3 mg/dL (ref 8.4–10.5)
Chloride: 101 mEq/L (ref 96–112)
Creatinine, Ser: 1.03 mg/dL (ref 0.40–1.20)
GFR: 62.55 mL/min (ref >60.00)
Glucose, Bld: 80 mg/dL (ref 70–99)
Potassium: 3.9 mEq/L (ref 3.5–5.1)
Sodium: 138 mEq/L (ref 135–145)

## 2019-01-13 LAB — FSH/LH
FSH: 5.9 m[IU]/mL
LH: 17 m[IU]/mL

## 2019-01-13 LAB — PROLACTIN: Prolactin: 13.2 ng/mL (ref 4.8–23.3)

## 2019-01-13 LAB — TESTOSTERONE, TOTAL, LC/MS/MS: Testosterone, total: 59.1 ng/dL — ABNORMAL HIGH (ref 10.0–55.0)

## 2019-01-13 LAB — 17-HYDROXYPROGESTERONE: 17-Hydroxyprogesterone: 151 ng/dL

## 2019-01-13 LAB — TESTOSTERONE, FREE AND TOTAL (INCLUDES SHBG)-(MALES)
% Free Testosterone: 0.5 %
Free Testosterone, S: 2.5 pg/mL
Sex Hormone Binding Globulin: 111.4 nmol/L
Testosterone, Serum (Total): 50 ng/dL

## 2019-01-14 ENCOUNTER — Telehealth: Payer: Self-pay | Admitting: Internal Medicine

## 2019-01-14 NOTE — Telephone Encounter (Signed)
I have discussed her lab results as below with the patient.     We did discuss the testosterone results discrepancy between the essays which is reassuring at this time. Her testosterone is minimally elevated , we discussed in ovarian tumors level are typically > 150 .    We discussed treatment of reducing testosterone with COC pills and spironolactone but with ambiguous  Results I suggested we repeat those results on next visit.    We discussed symptoms of virilization to look for in the meantime such as excessive body hair growth, deepening of voice, enlarged clitoris etc   Pt expressed understanding and in agreement.   Results for MERELIN, HUMAN (MRN [id number]) as of 01/14/2019 09:51  Ref. Range 01/08/2019 08:16  Sodium Latest Ref Range: 135 - 145 mEq/L 138  Potassium Latest Ref Range: 3.5 - 5.1 mEq/L 3.9  Chloride Latest Ref Range: 96 - 112 mEq/L 101  CO2 Latest Ref Range: 19 - 32 mEq/L 27  Glucose Latest Ref Range: 70 - 99 mg/dL 80  BUN Latest Ref Range: 6 - 23 mg/dL 14  Creatinine Latest Ref Range: 0.40 - 1.20 mg/dL 1.03  Calcium Latest Ref Range: 8.4 - 10.5 mg/dL 9.3  GFR Latest Ref Range: >60.00 mL/min 62.55  17-Hydroxyprogesterone Latest Units: ng/dL 151  LH Latest Units: mIU/mL 17.0  FSH Latest Units: mIU/mL 5.9  Prolactin Latest Ref Range: 4.8 - 23.3 ng/mL 13.2  Testosterone, total Latest Ref Range: 10.0 - 55.0 ng/dL 59.1 (H)  Testosterone, Serum (Total) Latest Units: ng/dL 50  % Free Testosterone Latest Units: % 0.5  Free Testosterone, S Latest Units: pg/mL 2.5  Sex Hormone Binding Globulin Latest Units: nmol/L 111.4    Abby Nena Jordan, MD  [facility] Endocrinology  [facility] [address] [place], [place] [address] Phone: [phone] FAX: [phone]

## 2019-02-27 MED FILL — MEDROXYPROGESTERONE 10 MG T: 10 | 5 days supply | Qty: 5 | Fill #0

## 2019-03-05 ENCOUNTER — Encounter: Payer: Self-pay | Admitting: Internal Medicine

## 2019-03-28 ENCOUNTER — Encounter: Payer: Self-pay | Admitting: Family Medicine

## 2019-03-28 DIAGNOSIS — F411 Generalized anxiety disorder: Secondary | ICD-10-CM

## 2019-03-28 DIAGNOSIS — F410 Panic disorder [episodic paroxysmal anxiety] without agoraphobia: Secondary | ICD-10-CM

## 2019-03-28 MED ORDER — ALPRAZOLAM 0.25 MG PO TABS: 0.2500 mg | ORAL_TABLET | Freq: Two times a day (BID) | ORAL | 1 refills | Status: DC | PRN

## 2019-03-28 MED ORDER — BUTALBITAL-ACETAMINOPHEN 50-325 MG PO TABS: ORAL_TABLET | ORAL | 0 refills | Status: DC

## 2019-03-28 MED FILL — ALPRAZolam 0.25 MG TABS: 0.25 | 15 days supply | Qty: 30 | Fill #0

## 2019-03-28 MED FILL — BUTALBITAL-ACETAMINOPHEN 50: 50-325 | 5 days supply | Qty: 30 | Fill #0

## 2019-04-08 MED FILL — NITROFURANTOIN MONO-MCR 100: 100 | 7 days supply | Qty: 14 | Fill #0

## 2019-04-22 ENCOUNTER — Ambulatory Visit: Payer: 59 | Admitting: Internal Medicine

## 2019-04-22 ENCOUNTER — Other Ambulatory Visit (HOSPITAL_COMMUNITY)
Admission: RE | Admit: 2019-04-22 | Discharge: 2019-04-22 | Disposition: A | Discharge status: Home / Self Care | Payer: 59 | Source: Ambulatory Visit | Attending: Obstetrics and Gynecology | Admitting: Obstetrics and Gynecology

## 2019-04-22 ENCOUNTER — Other Ambulatory Visit (HOSPITAL_COMMUNITY): Payer: 59

## 2019-04-22 DIAGNOSIS — Z20828 Contact with and (suspected) exposure to other viral communicable diseases: Secondary | ICD-10-CM | POA: Diagnosis not present

## 2019-04-22 DIAGNOSIS — Z01812 Encounter for preprocedural laboratory examination: Secondary | ICD-10-CM | POA: Insufficient documentation

## 2019-04-23 LAB — NOVEL CORONAVIRUS, NAA (HOSP ORDER, SEND-OUT TO REF LAB; TAT 18-24 HRS): SARS-CoV-2, NAA: NOT DETECTED

## 2019-04-24 ENCOUNTER — Encounter (HOSPITAL_BASED_OUTPATIENT_CLINIC_OR_DEPARTMENT_OTHER): Payer: Self-pay | Admitting: *Deleted

## 2019-04-24 ENCOUNTER — Other Ambulatory Visit: Payer: Self-pay

## 2019-04-24 NOTE — Progress Notes (Signed)
Spoke w/ via phone for pre-op interview--- PT Lab needs dos----  Hg, Urine preg COVID test ------ 04-22-2019 Arrive at ------- 0600 NPO after ------ MN Medications to take morning of surgery ----- if needed may take migraine prn med / xanax am dos w/ sips of water Diabetic medication ----- n/a Patient Special Instructions ----- n/a Pre-Op special Istructions ----- pre-op orders pending.  LVM for Jarrett Soho, or scheduler for dr Terri Piedra. Patient verbalized understanding of instructions that were given at this phone interview. Patient denies shortness of breath, chest pain, fever, cough a this phone interview.

## 2019-04-24 NOTE — H&P (Signed)
Kimberly Blankenship is an 33 y.o. female here for scheduled surgery. Pt with persistent left ovarian cyst for 1-2 years now causing pelvic pain with intercourse. Pt is now interested in definitive treatment.   Pertinent Gynecological History: Menses: flow is moderate Bleeding: intermenstrual bleeding occasionally before menses Contraception: condoms DES exposure: unknown Blood transfusions: none Sexually transmitted diseases: no past history Previous GYN Procedures: none  Last mammogram: n/a Date:  Last pap: normal Date: 01/2019 OB History: G0, P0   Menstrual History: Menarche age: 53 Patient's last menstrual period was 04/10/2019 (approximate).    Past Medical History:  Diagnosis Date  . Anxiety   . Depression   . GERD (gastroesophageal reflux disease)    occasional  . Headache, migraine   . IBS (irritable bowel syndrome)   . IDA (iron deficiency anemia)   . Left ovarian cyst     Past Surgical History:  Procedure Laterality Date  . NO PAST SURGERIES    . wisdom teeth      Family History  Problem Relation Age of Onset  . Arthritis Mother   . Diabetes Mother   . Miscarriages / [place] Mother   . Fibroids Mother   . Depression Brother   . Arthritis Maternal Grandmother   . Fibroids Maternal Grandmother   . Hypertension Maternal Grandfather   . Stomach cancer Maternal Grandfather   . Arthritis Paternal Grandmother   . COPD Paternal Grandmother   . Other Paternal Grandfather        Brain tumor  . Uterine cancer Paternal Aunt   . Colon cancer Neg Hx   . Esophageal cancer Neg Hx   . Pancreatic cancer Neg Hx   . Liver disease Neg Hx     Social History:  reports that she has never smoked. She has never used smokeless tobacco. She reports current alcohol use. She reports that she does not use drugs.  Allergies:  Allergies  Allergen Reactions  . Sulfa Antibiotics Itching and Rash    No medications prior to admission.    ROS  Height 5\' 7"  (1.702 m), weight  65.8 kg, last menstrual period 04/10/2019. Physical Exam  No results found for this or any previous visit (from the past 24 hour(s)).  No results found.  Assessment/Plan: 33yo G0 female with persistent left ovarian cyst suspected to be a dermoid, dyspareunia.  Pt to OR when ready Consent for laparoscopic excision of left cyst, poss open, poss oopherectomy All questions about risks/benefits and alternatives have been addressed Kimberly Blankenship 04/24/2019, 1:45 PM

## 2019-04-25 ENCOUNTER — Ambulatory Visit (HOSPITAL_BASED_OUTPATIENT_CLINIC_OR_DEPARTMENT_OTHER): Payer: 59 | Admitting: Anesthesiology

## 2019-04-25 ENCOUNTER — Encounter (HOSPITAL_BASED_OUTPATIENT_CLINIC_OR_DEPARTMENT_OTHER)
Admission: RE | Disposition: A | Discharge status: Home / Self Care | Payer: Self-pay | Source: Home / Self Care | Attending: Obstetrics and Gynecology

## 2019-04-25 ENCOUNTER — Ambulatory Visit (HOSPITAL_BASED_OUTPATIENT_CLINIC_OR_DEPARTMENT_OTHER)
Admission: RE | Admit: 2019-04-25 | Discharge: 2019-04-25 | Disposition: A | Discharge status: Home / Self Care | Payer: 59 | Attending: Obstetrics and Gynecology | Admitting: Obstetrics and Gynecology

## 2019-04-25 ENCOUNTER — Encounter (HOSPITAL_BASED_OUTPATIENT_CLINIC_OR_DEPARTMENT_OTHER): Payer: Self-pay

## 2019-04-25 DIAGNOSIS — N941 Unspecified dyspareunia: Secondary | ICD-10-CM | POA: Insufficient documentation

## 2019-04-25 DIAGNOSIS — D509 Iron deficiency anemia, unspecified: Secondary | ICD-10-CM | POA: Diagnosis not present

## 2019-04-25 DIAGNOSIS — N801 Endometriosis of ovary: Secondary | ICD-10-CM | POA: Diagnosis not present

## 2019-04-25 DIAGNOSIS — N803 Endometriosis of pelvic peritoneum: Secondary | ICD-10-CM | POA: Insufficient documentation

## 2019-04-25 DIAGNOSIS — Z882 Allergy status to sulfonamides status: Secondary | ICD-10-CM | POA: Insufficient documentation

## 2019-04-25 DIAGNOSIS — N921 Excessive and frequent menstruation with irregular cycle: Secondary | ICD-10-CM | POA: Diagnosis not present

## 2019-04-25 DIAGNOSIS — N83202 Unspecified ovarian cyst, left side: Secondary | ICD-10-CM | POA: Diagnosis present

## 2019-04-25 DIAGNOSIS — F411 Generalized anxiety disorder: Secondary | ICD-10-CM | POA: Diagnosis not present

## 2019-04-25 DIAGNOSIS — N736 Female pelvic peritoneal adhesions (postinfective): Secondary | ICD-10-CM | POA: Diagnosis not present

## 2019-04-25 DIAGNOSIS — K219 Gastro-esophageal reflux disease without esophagitis: Secondary | ICD-10-CM | POA: Diagnosis not present

## 2019-04-25 DIAGNOSIS — N809 Endometriosis, unspecified: Secondary | ICD-10-CM

## 2019-04-25 HISTORY — DX: Iron deficiency anemia, unspecified: D50.9

## 2019-04-25 HISTORY — DX: Unspecified ovarian cyst, left side: N83.202

## 2019-04-25 HISTORY — DX: Endometriosis, unspecified: N80.9

## 2019-04-25 HISTORY — DX: Irritable bowel syndrome, unspecified: K58.9

## 2019-04-25 HISTORY — PX: LYSIS OF ADHESION: SHX5961

## 2019-04-25 LAB — ABO/RH: ABO/RH(D): AB NEG

## 2019-04-25 LAB — TYPE AND SCREEN
ABO/RH(D): AB NEG
Antibody Screen: NEGATIVE

## 2019-04-25 LAB — POCT PREGNANCY, URINE: Preg Test, Ur: NEGATIVE

## 2019-04-25 SURGERY — LAPAROTOMY, FOR LYSIS OF ADHESIONS
Anesthesia: General

## 2019-04-25 MED ORDER — ROCURONIUM BROMIDE 10 MG/ML (PF) SYRINGE
PREFILLED_SYRINGE | INTRAVENOUS | Status: AC
  Filled 2019-04-25: qty 10

## 2019-04-25 MED ORDER — DEXAMETHASONE SODIUM PHOSPHATE 10 MG/ML IJ SOLN
INTRAMUSCULAR | Status: DC | PRN
  Administered 2019-04-25: 5 mg via INTRAVENOUS

## 2019-04-25 MED ORDER — LACTATED RINGERS IV SOLN
INTRAVENOUS | Status: DC
  Administered 2019-04-25: 50 mL/h via INTRAVENOUS
  Filled 2019-04-25: qty 1000

## 2019-04-25 MED ORDER — ONDANSETRON HCL 4 MG PO TABS
4.0000 mg | ORAL_TABLET | Freq: Four times a day (QID) | ORAL | Status: DC | PRN
  Filled 2019-04-25: qty 1

## 2019-04-25 MED ORDER — FENTANYL CITRATE (PF) 100 MCG/2ML IJ SOLN
INTRAMUSCULAR | Status: AC
  Filled 2019-04-25: qty 4

## 2019-04-25 MED ORDER — DEXAMETHASONE SODIUM PHOSPHATE 10 MG/ML IJ SOLN
INTRAMUSCULAR | Status: AC
  Filled 2019-04-25: qty 1

## 2019-04-25 MED ORDER — PROPOFOL 10 MG/ML IV BOLUS
INTRAVENOUS | Status: DC | PRN
  Administered 2019-04-25: 150 mg via INTRAVENOUS

## 2019-04-25 MED ORDER — ACETAMINOPHEN 500 MG PO TABS
1000.0000 mg | ORAL_TABLET | ORAL | Status: AC
  Administered 2019-04-25: 1000 mg via ORAL
  Filled 2019-04-25: qty 2

## 2019-04-25 MED ORDER — ONDANSETRON HCL 4 MG/2ML IJ SOLN
INTRAMUSCULAR | Status: DC | PRN
  Administered 2019-04-25: 4 mg via INTRAVENOUS

## 2019-04-25 MED ORDER — ONDANSETRON HCL 4 MG/2ML IJ SOLN
INTRAMUSCULAR | Status: AC
  Filled 2019-04-25: qty 2

## 2019-04-25 MED ORDER — SCOPOLAMINE 1 MG/3DAYS TD PT72
MEDICATED_PATCH | TRANSDERMAL | Status: AC
  Filled 2019-04-25: qty 1

## 2019-04-25 MED ORDER — OXYCODONE-ACETAMINOPHEN 5-325 MG PO TABS: 1.0000 | ORAL_TABLET | ORAL | 0 refills | Status: DC | PRN

## 2019-04-25 MED ORDER — LIDOCAINE 2% (20 MG/ML) 5 ML SYRINGE
INTRAMUSCULAR | Status: DC | PRN
  Administered 2019-04-25: 80 mg via INTRAVENOUS

## 2019-04-25 MED ORDER — OXYCODONE HCL 5 MG PO TABS
ORAL_TABLET | ORAL | Status: AC
  Filled 2019-04-25: qty 1

## 2019-04-25 MED ORDER — ONDANSETRON HCL 4 MG/2ML IJ SOLN
4.0000 mg | Freq: Four times a day (QID) | INTRAMUSCULAR | Status: DC | PRN
  Filled 2019-04-25: qty 2

## 2019-04-25 MED ORDER — MIDAZOLAM HCL 2 MG/2ML IJ SOLN
INTRAMUSCULAR | Status: AC
  Filled 2019-04-25: qty 2

## 2019-04-25 MED ORDER — KETOROLAC TROMETHAMINE 30 MG/ML IJ SOLN
30.0000 mg | Freq: Once | INTRAMUSCULAR | Status: DC | PRN
  Filled 2019-04-25: qty 1

## 2019-04-25 MED ORDER — LIDOCAINE 2% (20 MG/ML) 5 ML SYRINGE
INTRAMUSCULAR | Status: AC
  Filled 2019-04-25: qty 5

## 2019-04-25 MED ORDER — OXYCODONE HCL 5 MG PO TABS
5.0000 mg | ORAL_TABLET | ORAL | Status: DC | PRN
  Filled 2019-04-25: qty 2

## 2019-04-25 MED ORDER — SIMETHICONE 80 MG PO CHEW
80.0000 mg | CHEWABLE_TABLET | Freq: Four times a day (QID) | ORAL | Status: DC | PRN
  Filled 2019-04-25: qty 1

## 2019-04-25 MED ORDER — ROCURONIUM BROMIDE 10 MG/ML (PF) SYRINGE
PREFILLED_SYRINGE | INTRAVENOUS | Status: DC | PRN
  Administered 2019-04-25: 50 mg via INTRAVENOUS

## 2019-04-25 MED ORDER — KETOROLAC TROMETHAMINE 30 MG/ML IJ SOLN
30.0000 mg | Freq: Once | INTRAMUSCULAR | Status: DC
  Filled 2019-04-25: qty 1

## 2019-04-25 MED ORDER — BUPIVACAINE HCL (PF) 0.25 % IJ SOLN
INTRAMUSCULAR | Status: DC | PRN
  Administered 2019-04-25: 10 mL

## 2019-04-25 MED ORDER — MIDAZOLAM HCL 2 MG/2ML IJ SOLN
INTRAMUSCULAR | Status: DC | PRN
  Administered 2019-04-25: 2 mg via INTRAVENOUS

## 2019-04-25 MED ORDER — SUGAMMADEX SODIUM 200 MG/2ML IV SOLN
INTRAVENOUS | Status: DC | PRN
  Administered 2019-04-25: 200 mg via INTRAVENOUS

## 2019-04-25 MED ORDER — FENTANYL CITRATE (PF) 100 MCG/2ML IJ SOLN
25.0000 ug | INTRAMUSCULAR | Status: DC | PRN
  Filled 2019-04-25: qty 1

## 2019-04-25 MED ORDER — PROPOFOL 10 MG/ML IV BOLUS
INTRAVENOUS | Status: AC
  Filled 2019-04-25: qty 40

## 2019-04-25 MED ORDER — OXYCODONE HCL 5 MG/5ML PO SOLN
5.0000 mg | Freq: Once | ORAL | Status: AC | PRN
  Filled 2019-04-25: qty 5

## 2019-04-25 MED ORDER — OXYCODONE HCL 5 MG PO TABS
5.0000 mg | ORAL_TABLET | Freq: Once | ORAL | Status: AC | PRN
  Administered 2019-04-25: 11:00:00 5 mg via ORAL
  Filled 2019-04-25: qty 1

## 2019-04-25 MED ORDER — KETOROLAC TROMETHAMINE 30 MG/ML IJ SOLN
INTRAMUSCULAR | Status: DC | PRN
  Administered 2019-04-25: 30 mg via INTRAVENOUS

## 2019-04-25 MED ORDER — FENTANYL CITRATE (PF) 100 MCG/2ML IJ SOLN
INTRAMUSCULAR | Status: DC | PRN
  Administered 2019-04-25 (×3): 50 ug via INTRAVENOUS

## 2019-04-25 MED ORDER — LACTATED RINGERS IV SOLN
INTRAVENOUS | Status: DC
  Filled 2019-04-25: qty 1000

## 2019-04-25 MED ORDER — PROMETHAZINE HCL 25 MG/ML IJ SOLN
6.2500 mg | INTRAMUSCULAR | Status: DC | PRN
  Filled 2019-04-25: qty 1

## 2019-04-25 MED ORDER — IBUPROFEN 800 MG PO TABS
800.0000 mg | ORAL_TABLET | Freq: Three times a day (TID) | ORAL | Status: DC
  Filled 2019-04-25: qty 1

## 2019-04-25 MED ORDER — KETOROLAC TROMETHAMINE 30 MG/ML IJ SOLN
INTRAMUSCULAR | Status: AC
  Filled 2019-04-25: qty 1

## 2019-04-25 MED ORDER — SCOPOLAMINE 1 MG/3DAYS TD PT72
1.0000 | MEDICATED_PATCH | TRANSDERMAL | Status: DC
  Administered 2019-04-25: 1 via TRANSDERMAL
  Administered 2019-04-25: 1.5 mg via TRANSDERMAL
  Filled 2019-04-25: qty 1

## 2019-04-25 MED ORDER — IBUPROFEN 600 MG PO TABS: 600.0000 mg | ORAL_TABLET | Freq: Four times a day (QID) | ORAL | 1 refills | Status: DC | PRN

## 2019-04-25 MED ORDER — ACETAMINOPHEN 500 MG PO TABS
ORAL_TABLET | ORAL | Status: AC
  Filled 2019-04-25: qty 2

## 2019-04-25 MED ORDER — ARTIFICIAL TEARS OPHTHALMIC OINT
TOPICAL_OINTMENT | OPHTHALMIC | Status: AC
  Filled 2019-04-25: qty 7

## 2019-04-25 MED FILL — IBUPROFEN 600 MG TABLET: 600 | 10 days supply | Qty: 40 | Fill #0

## 2019-04-25 MED FILL — OXYCODONE-ACETAMINOPHEN 5-3: 5-325 | 7 days supply | Qty: 20 | Fill #0

## 2019-04-25 SURGICAL SUPPLY — 42 items
CABLE HIGH FREQUENCY MONO STRZ (ELECTRODE) ×1 IMPLANT
CATH ROBINSON RED A/P 16FR (CATHETERS) IMPLANT
COVER MAYO STAND STRL (DRAPES) ×3 IMPLANT
COVER WAND RF STERILE (DRAPES) ×3 IMPLANT
DECANTER SPIKE VIAL GLASS SM (MISCELLANEOUS) ×3 IMPLANT
DERMABOND ADVANCED (GAUZE/BANDAGES/DRESSINGS) ×1
DERMABOND ADVANCED .7 DNX12 (GAUZE/BANDAGES/DRESSINGS) ×2 IMPLANT
DILATOR CANAL MILEX (MISCELLANEOUS) ×1 IMPLANT
DRSG OPSITE POSTOP 3X4 (GAUZE/BANDAGES/DRESSINGS) IMPLANT
DURAPREP 26ML APPLICATOR (WOUND CARE) ×3 IMPLANT
GAUZE 4X4 16PLY RFD (DISPOSABLE) ×3 IMPLANT
GLOVE BIO SURGEON STRL SZ 6.5 (GLOVE) ×6 IMPLANT
GLOVE BIOGEL PI IND STRL 7.0 (GLOVE) IMPLANT
GLOVE BIOGEL PI IND STRL 7.5 (GLOVE) IMPLANT
GLOVE BIOGEL PI INDICATOR 7.0 (GLOVE) ×2
GLOVE BIOGEL PI INDICATOR 7.5 (GLOVE) ×2
GLOVE SURG SS PI 7.5 STRL IVOR (GLOVE) ×2 IMPLANT
GOWN STRL REUS W/ TWL LRG LVL3 (GOWN DISPOSABLE) IMPLANT
GOWN STRL REUS W/TWL LRG LVL3 (GOWN DISPOSABLE) ×10 IMPLANT
MANIPULATOR UTERINE 7CM CLEARV (MISCELLANEOUS) ×3 IMPLANT
NEEDLE HYPO 22GX1.5 SAFETY (NEEDLE) ×1 IMPLANT
NS IRRIG 500ML POUR BTL (IV SOLUTION) ×1 IMPLANT
PACK LAPAROSCOPY BASIN (CUSTOM PROCEDURE TRAY) ×3 IMPLANT
PACK TRENDGUARD 450 HYBRID PRO (MISCELLANEOUS) ×2 IMPLANT
POUCH SPECIMEN RETRIEVAL 10MM (ENDOMECHANICALS) IMPLANT
PROTECTOR NERVE ULNAR (MISCELLANEOUS) ×6 IMPLANT
SET IRRIG TUBING LAPAROSCOPIC (IRRIGATION / IRRIGATOR) IMPLANT
SET TUBE SMOKE EVAC HIGH FLOW (TUBING) ×3 IMPLANT
SHEARS HARMONIC ACE PLUS 36CM (ENDOMECHANICALS) ×1 IMPLANT
SLEEVE XCEL OPT CAN 5 100 (ENDOMECHANICALS) ×2 IMPLANT
SOLUTION ELECTROLUBE (MISCELLANEOUS) IMPLANT
SUT VIC AB 3-0 PS2 18 (SUTURE) ×1
SUT VIC AB 3-0 PS2 18XBRD (SUTURE) ×2 IMPLANT
SUT VICRYL 0 UR6 27IN ABS (SUTURE) IMPLANT
SYR 20ML LL LF (SYRINGE) ×1 IMPLANT
SYSTEM CARTER THOMASON II (TROCAR) IMPLANT
TOWEL OR 17X26 10 PK STRL BLUE (TOWEL DISPOSABLE) ×6 IMPLANT
TRAY FOLEY W/BAG SLVR 14FR LF (SET/KITS/TRAYS/PACK) ×1 IMPLANT
TRENDGUARD 450 HYBRID PRO PACK (MISCELLANEOUS) ×3
TROCAR XCEL NON-BLD 11X100MML (ENDOMECHANICALS) IMPLANT
TROCAR XCEL NON-BLD 5MMX100MML (ENDOMECHANICALS) ×7 IMPLANT
WARMER LAPAROSCOPE (MISCELLANEOUS) ×3 IMPLANT

## 2019-04-25 NOTE — Anesthesia Preprocedure Evaluation (Addendum)
Anesthesia Evaluation  Patient identified by MRN, date of birth, ID band Patient awake    Reviewed: Allergy & Precautions, NPO status , Patient's Chart, lab work & pertinent test results  Airway Mallampati: II  TM Distance: >3 FB Neck ROM: Full    Dental no notable dental hx. (+) Teeth Intact, Dental Advisory Given   Pulmonary neg pulmonary ROS,    Pulmonary exam normal breath sounds clear to auscultation       Cardiovascular negative cardio ROS Normal cardiovascular exam Rhythm:Regular Rate:Normal     Neuro/Psych  Headaches, PSYCHIATRIC DISORDERS Anxiety Depression    GI/Hepatic Neg liver ROS, GERD  ,  Endo/Other  negative endocrine ROS  Renal/GU negative Renal ROS  negative genitourinary   Musculoskeletal negative musculoskeletal ROS (+)   Abdominal   Peds negative pediatric ROS (+)  Hematology negative hematology ROS (+)   Anesthesia Other Findings Vagal response to IV insert this am  Reproductive/Obstetrics negative OB ROS                           Anesthesia Physical Anesthesia Plan  ASA: II  Anesthesia Plan: General   Post-op Pain Management:    Induction: Intravenous  PONV Risk Score and Plan: 4 or greater and Ondansetron, Dexamethasone, Midazolam, Scopolamine patch - Pre-op and Treatment may vary due to age or medical condition  Airway Management Planned: Oral ETT  Additional Equipment:   Intra-op Plan:   Post-operative Plan: Extubation in OR  Informed Consent: I have reviewed the patients History and Physical, chart, labs and discussed the procedure including the risks, benefits and alternatives for the proposed anesthesia with the patient or authorized representative who has indicated his/her understanding and acceptance.     Dental advisory given  Plan Discussed with: CRNA and Surgeon  Anesthesia Plan Comments:         Anesthesia Quick Evaluation

## 2019-04-25 NOTE — Anesthesia Procedure Notes (Signed)
Procedure Name: Intubation Date/Time: 04/25/2019 8:20 AM Performed by: Wanita Chamberlain, CRNA Pre-anesthesia Checklist: Timeout performed, Patient being monitored, Suction available, Emergency Drugs available and Patient identified Patient Re-evaluated:Patient Re-evaluated prior to induction Oxygen Delivery Method: Circle system utilized Preoxygenation: Pre-oxygenation with 100% oxygen Induction Type: IV induction Ventilation: Mask ventilation without difficulty Laryngoscope Size: Mac and 3 Grade View: Grade I Tube type: Oral Tube size: 7.0 mm Number of attempts: 1 Airway Equipment and Method: Stylet Placement Confirmation: ETT inserted through vocal cords under direct vision,  positive ETCO2,  CO2 detector and breath sounds checked- equal and bilateral Secured at: 22 cm Tube secured with: Tape Dental Injury: Teeth and Oropharynx as per pre-operative assessment

## 2019-04-25 NOTE — Anesthesia Postprocedure Evaluation (Signed)
Anesthesia Post Note  Patient: Kimberly Blankenship  Procedure(s) Performed: LYSIS OF ADHESION, FULGURATION OF ENDOMETRIOSIS (N/A )     Patient location during evaluation: PACU Anesthesia Type: General Level of consciousness: awake and alert Pain management: pain level controlled Vital Signs Assessment: post-procedure vital signs reviewed and stable Respiratory status: spontaneous breathing, nonlabored ventilation, respiratory function stable and patient connected to nasal cannula oxygen Cardiovascular status: blood pressure returned to baseline and stable Postop Assessment: no apparent nausea or vomiting Anesthetic complications: no    Last Vitals:  Vitals:   04/25/19 1000 04/25/19 1015  BP: 107/61 117/64  Pulse: 74 72  Resp: 15 14  Temp:    SpO2: 100% 99%    Last Pain:  Vitals:   04/25/19 0943  TempSrc:   PainSc: 0-No pain                 Eviana Sibilia S

## 2019-04-25 NOTE — Transfer of Care (Signed)
Immediate Anesthesia Transfer of Care Note  Patient: Kimberly Blankenship  Procedure(s) Performed: LYSIS OF ADHESION, FULGURATION OF ENDOMETRIOSIS (N/A )  Patient Location: PACU  Anesthesia Type:General  Level of Consciousness: awake, alert , oriented and patient cooperative  Airway & Oxygen Therapy: Patient Spontanous Breathing and Patient connected to nasal cannula oxygen  Post-op Assessment: Report given to RN and Post -op Vital signs reviewed and stable  Post vital signs: Reviewed and stable  Last Vitals:  Vitals Value Taken Time  BP    Temp    Pulse 87 04/25/19 0941  Resp 15 04/25/19 0941  SpO2 100 % 04/25/19 0941  Vitals shown include unvalidated device data.  Last Pain:  Vitals:   04/25/19 0752  TempSrc: Oral  PainSc: 0-No pain      Patients Stated Pain Goal: 3 ([id number])  Complications: No apparent anesthesia complications

## 2019-04-25 NOTE — Discharge Instructions (Signed)
Call office with any concerns [place] Instructions  Activity: Get plenty of rest for the remainder of the day. A responsible adult should stay with you for 24 hours following the procedure.  For the next 24 hours, DO NOT: -Drive a car -Paediatric nurse -Drink alcoholic beverages -Take any medication unless instructed by your physician -Make any legal decisions or sign important papers.  Meals: Start with liquid foods such as gelatin or soup. Progress to regular foods as tolerated. Avoid greasy, spicy, heavy foods. If nausea and/or vomiting occur, drink only clear liquids until the nausea and/or vomiting subsides. Call your physician if vomiting continues.  Special Instructions/Symptoms: Your throat may feel dry or sore from the anesthesia or the breathing tube placed in your throat during surgery. If this causes discomfort, gargle with warm salt water. The discomfort should disappear within 24 hours.  If you had a scopolamine patch placed behind your ear for the management of post- operative nausea and/or vomiting:  1. The medication in the patch is effective for 72 hours, after which it should be removed.  Wrap patch in a tissue and discard in the trash. Wash hands thoroughly with soap and water. 2. You may remove the patch earlier than 72 hours if you experience unpleasant side effects which may include dry mouth, dizziness or visual disturbances. 3. Avoid touching the patch. Wash your hands with soap and water after contact with the patch.     DISCHARGE INSTRUCTIONS: Laparoscopy  The following instructions have been prepared to help you care for yourself upon your return home today.  Wound care:  Do not get the incision wet for the first 24 hours. The incision should be kept clean and dry.  The Band-Aids or dressings may be removed the day after surgery.  Should the incision become sore, red, and swollen after the first week, check with your  doctor.  Personal hygiene:  Shower the day after your procedure.  Activity and limitations:  Do NOT drive or operate any equipment today.  Do NOT lift anything more than 15 pounds for 2-3 weeks after surgery.  Do NOT rest in bed all day.  Walking is encouraged. Walk each day, starting slowly with 5-minute walks 3 or 4 times a day. Slowly increase the length of your walks.  Walk up and down stairs slowly.  Do NOT do strenuous activities, such as golfing, playing tennis, bowling, running, biking, weight lifting, gardening, mowing, or vacuuming for 2-4 weeks. Ask your doctor when it is okay to start.  Diet: Eat a light meal as desired this evening. You may resume your usual diet tomorrow.  Return to work: This is dependent on the type of work you do. For the most part you can return to a desk job within a week of surgery. If you are more active at work, please discuss this with your doctor.  What to expect after your surgery: You may have a slight burning sensation when you urinate on the first day. You may have a very small amount of blood in the urine. Expect to have a small amount of vaginal discharge/light bleeding for 1-2 weeks. It is not unusual to have abdominal soreness and bruising for up to 2 weeks. You may be tired and need more rest for about 1 week. You may experience shoulder pain for 24-72 hours. Lying flat in bed may relieve it.  Call your doctor for any of the following:  Develop a fever  of 100.4 or greater  Inability to urinate 6 hours after discharge from hospital  Severe pain not relieved by pain medications  Persistent of heavy bleeding at incision site  Redness or swelling around incision site after a week  Increasing nausea or vomiting

## 2019-04-25 NOTE — Interval H&P Note (Signed)
History and Physical Interval Note: No change since H/P done Pt had a vasovagal episode while IV being placed in PACU Plan on proceeding with surgery as planned  04/25/2019 7:52 AM  Kimberly Blankenship  has presented today for surgery, with the diagnosis of left cyst.  The various methods of treatment have been discussed with the patient and family. After consideration of risks, benefits and other options for treatment, the patient has consented to  Procedure(s) with comments: LAPAROSCOPIC OVARIAN CYSTECTOMY, POSSIBLE OPEN (Left) - possible open LAPAROSCOPIC OOPHORECTOMY (N/A) as a surgical intervention.  The patient's history has been reviewed, patient examined, no change in status, stable for surgery.  I have reviewed the patient's chart and labs.  Questions were answered to the patient's satisfaction.     [name]

## 2019-04-25 NOTE — Op Note (Signed)
Operative Note    Preoperative Diagnosis: Left ovarian cyst - suspected dermoid   2. Dyspareunia   Postoperative Diagnosis: Endometriosis 2. Adhesions of fallopian tube and bowel to pelvic sidewall   Procedure: Diagnostic laparoscopy, fulgation of endometriosis and lysis of adhesions   Surgeon: Mickle Mallory DO Assist: Jeanmarie Hubert MD  Anesthesia: General  Fluids: LR 1159ml EBL: <34ml UOP: 18ml   Findings: Endometriosis in posterior and anterior cul-de sac, on uterosacral ligaments and on left ovary and lateral sidewall   Specimen: none   Procedure Note Patient was taken to the operating room where general anesthesia was obtained without difficulty. She was then prepped and draped in the normal sterile fashion in the dorsal lithotomy position with both arms tucked at her sides. An appropriate timeout was performed. A speculum was then placed within the vagina and a ClearVue uterine manipulator placed after cervix dilated.  The bladder was emptied. Attention was then turned to the patient's abdomen after draping where the infraumbilical area was injected with approximately 3cc of quarter percent Marcaine. A 5 mm incision was then made below the umbilicus and the optiview trocar and camera easily introduced into the abdominal cavity.Gas flow was then applied and a pneumoperitoneum obtained with approximate 3 L of CO2 gas.  A second third 49mm trocars were placed in the patients left and right lower uterine segments and a better survey done.   With patient in Trendelenburg the uterus and tubes and ovaries were inspected with findings: Endometriosis in posterior and anterior cul-de sac, on uterosacral ligaments and on left ovary and lateral sidewall. The harmonic scalpel was then used to lyse the adhesions of bowel and then fallopian tube freeing them up. Hemostasis noted. Next the endometrial implants on the left ovary, lateral sidewall, posterior cul de sac and uterosacral ligaments then  the anterior cul de sac were fulgarated. Again hemostasis noted. A small adhesion and endometrial implant on the right side were also cauterized.    The remainder of the pelvis and abdomen were inspected and found to be normal. At this time, the patient was flattened and all instruments were removed from the abdomen.  The pneumoperitoneum reduced through the trocar. The trocas were finally removed under visualization. Incisions were closed with 4-0 suture abd dermabond applied. Patient was then awakened and taken to the recovery room in good condition. Counts were all confirmed correct x 2

## 2019-04-26 ENCOUNTER — Encounter (HOSPITAL_BASED_OUTPATIENT_CLINIC_OR_DEPARTMENT_OTHER): Payer: Self-pay | Admitting: Obstetrics and Gynecology

## 2019-05-02 LAB — POCT HEMOGLOBIN-HEMACUE: Hemoglobin: 15.2 g/dL — ABNORMAL HIGH (ref 12.0–15.0)

## 2019-05-20 ENCOUNTER — Encounter: Payer: Self-pay | Admitting: Family Medicine

## 2019-05-22 NOTE — Progress Notes (Addendum)
[facility] [place], [place], [place] [address] [phone]  Date:  05/23/2019   Name:  Kimberly Blankenship   DOB:  [birth year]   MRN:  [id number]  PCP:  Darreld Mclean, MD    Chief Complaint: Poor Circulation (hands and feet, numbness) and Low Iron   History of Present Illness:  Kimberly Blankenship is a 33 y.o. very pleasant female patient who presents with the following:  Patient with history of migraine headache, GERD, B12 deficiency, anemia Here today with concern of low iron-she has been dealing with thinning hair, and also feels like her circulation is poor.  Her hands and feet may get cold easily She is taking an over-the-counter iron supplement right now, 27 mg.  She is taking 2 daily  She recently underwent laparoscopic excision of an ovarian cyst and excision of adhesions.  She was also noted to have endometriosis during her operation- operation almost a month ago  She is not sure if this helped her as of yet- she is having some spotting this week  Last seen by myself in July with concern of hair loss Laboratory evaluation showed increased testosterone, I referred her to endocrinology She saw Dr. Kelton Pillar who thought that her ovarian cyst could possibly the culprit- however it turned out that she did not really have a cyst on her operation  She did see derm- they thought maybe stress, but then tested her iron and thought low iron was the more likely cause  She is not a vegetarian She does not tend to have heavy menstrual periods [name] Never smoker Flu shot today   Patient Active Problem List   Diagnosis Date Noted  . Endometriosis determined by laparoscopy 04/25/2019  . Dermoid cyst of left ovary 01/08/2019  . Hair loss disorder 01/08/2019  . Elevated testosterone level in female 01/08/2019  . Migraine 12/26/2018  . Skin cancer screening 05/28/2018  . Anemia 11/03/2017  . Metrorrhagia  11/01/2017  . GERD (gastroesophageal reflux disease) 04/29/2017  . Abnormality of immune system 03/24/2017  . Low testosterone level in female 03/24/2017  . Panic attacks 02/01/2017  . B12 deficiency 01/30/2017  . Low blood sugar 01/18/2017  . Great toe pain, left 11/03/2016  . Fatigue 06/20/2016  . Allergy to cockroaches 12/30/2015  . GAD (generalized anxiety disorder) 02/18/2014  . Chronic neck pain 01/23/2014  . Vitamin D deficiency 01/23/2014  . Oral contraceptive use 12/25/2012  . Allergic rhinitis 06/22/2012  . Chronic tension headaches 06/22/2012    Past Medical History:  Diagnosis Date  . Anxiety   . Depression   . Endometriosis determined by laparoscopy 04/25/2019  . GERD (gastroesophageal reflux disease)    occasional  . Headache, migraine   . IBS (irritable bowel syndrome)   . IDA (iron deficiency anemia)   . Left ovarian cyst     Past Surgical History:  Procedure Laterality Date  . LYSIS OF ADHESION N/A 04/25/2019   Procedure: LYSIS OF ADHESION, FULGURATION OF ENDOMETRIOSIS;  Surgeon: Sherlyn Hay, DO;  Location: [facility];  Service: Gynecology;  Laterality: N/A;  . NO PAST SURGERIES    . wisdom teeth      Social History   Tobacco Use  . Smoking status: Never Smoker  . Smokeless tobacco: Never Used  Substance Use Topics  . Alcohol use: Yes    Comment: occasionally  . Drug use: Never    Family History  Problem Relation Age of Onset  . Arthritis Mother   . Diabetes Mother   . Miscarriages / [place] Mother   . Fibroids Mother   . Depression Brother   . Arthritis Maternal Grandmother   . Fibroids Maternal Grandmother   . Hypertension Maternal Grandfather   . Stomach cancer Maternal Grandfather   . Arthritis Paternal Grandmother   . COPD Paternal Grandmother   . Other Paternal Grandfather        Brain tumor  . Uterine cancer Paternal Aunt   . Colon cancer Neg Hx   . Esophageal cancer Neg Hx   . Pancreatic cancer  Neg Hx   . Liver disease Neg Hx     Allergies  Allergen Reactions  . Sulfa Antibiotics Itching and Rash    Medication list has been reviewed and updated.  Current Outpatient Medications on File Prior to Visit  Medication Sig Dispense Refill  . ACETAMINOPHEN-BUTALBITAL 50-325 MG TABS May take 1-2 tablets every 4 hours as needed for headache. Max 6 per 24 hours 30 tablet 0  . ALPRAZolam (XANAX) 0.25 MG tablet Take 1 tablet (0.25 mg total) by mouth 2 (two) times daily as needed for anxiety. 30 tablet 1  . Ascorbic Acid (VITAMIN C) 500 MG CAPS Take by mouth daily.    . calcium carbonate (TUMS - DOSED IN MG ELEMENTAL CALCIUM) 500 MG chewable tablet Chew 1 tablet by mouth as needed for indigestion or heartburn.    . Cholecalciferol (VITAMIN D3) 5000 units CAPS Take 1 capsule by mouth daily.    . ferrous sulfate 325 (65 FE) MG EC tablet Take 325 mg by mouth daily.    Marland Kitchen FIBER ADULT GUMMIES PO Take by mouth as needed.    Marland Kitchen levocetirizine (XYZAL) 5 MG tablet Take 1 tablet (5 mg total) by mouth every evening. 30 tablet 2  . Menaquinone-7 (VITAMIN K2) 100 MCG CAPS Take 1 tablet by mouth daily.    Marland Kitchen tretinoin (RETIN-A) 0.025 % cream Apply 1 application topically daily.    . vitamin B-12 (CYANOCOBALAMIN) 1000 MCG tablet Take 1,000 mcg by mouth daily.     No current facility-administered medications on file prior to visit.     Review of Systems:  As per HPI- otherwise negative. She is taking 2 of her 27mg  elemental iron daily No fever or chills No chest pain or shortness of breath  Physical Examination: Vitals:   05/23/19 0817  BP: 118/74  Pulse: 94  Resp: 16  Temp: (!) 96.5 F (35.8 C)  SpO2: 98%   Vitals:   05/23/19 0817  Weight: 145 lb (65.8 kg)  Height: 5\' 7"  (1.702 m)   Body mass index is 22.71 kg/m. Ideal Body Weight: Weight in (lb) to have BMI = 25: 159.3  GEN: WDWN, NAD, Non-toxic, A & O x 3, slim build, looks well HEENT: Atraumatic, Normocephalic. Neck supple. No  masses, No LAD. Ears and Nose: No external deformity. CV: RRR, No M/G/R. No JVD. No thrill. No extra heart sounds. PULM: CTA B, no wheezes, crackles, rhonchi. No retractions. No resp. distress. No accessory muscle use. ABD: S, NT, ND, +BS. No rebound. No HSM.  Healing incisions from recent laparoscopic operation EXTR: No c/c/e NEURO Normal gait.  PSYCH: Normally interactive. Conversant. Not depressed or anxious appearing.  Calm demeanor.    Assessment and Plan: Vitamin D deficiency - Plan: Vitamin D (25 hydroxy)  B12 deficiency - Plan: B12 and Folate Panel  Anemia, unspecified type - Plan: CBC, B12  and Folate Panel, Ferritin  Non-seasonal allergic rhinitis due to other allergic trigger - Plan: montelukast (SINGULAIR) 10 MG tablet  Needs flu shot - Plan: Flu Vaccine QUAD 6+ mos PF IM (Fluarix Quad PF)  Following up today on a few concerns Flu shot given She has history of low iron, which is thought to be possibly contributing to hair loss.  We will check her ferritin today She is taking a somewhat low dose over-the-counter supplement-if level still low consider higher dose supplement, or possibly an iron infusion per hematology She has noted symptoms of allergies which are incompletely controlled with over-the-counter medications.  Call in [place] for her to try.  Advised her of black box warning Will plan further follow- up pending labs.  This visit occurred during the SARS-CoV-2 public health emergency.  Safety protocols were in place, including screening questions prior to the visit, additional usage of staff PPE, and extensive cleaning of exam room while observing appropriate contact time as indicated for disinfecting solutions.    Signed Lamar Blinks, MD  Received her labs, message to patient  Results for orders placed or performed in visit on 05/23/19  CBC  Result Value Ref Range   WBC 6.2 4.0 - 10.5 K/uL   RBC 4.82 3.87 - 5.11 Mil/uL   Platelets 219.0 150.0 - 400.0  K/uL   Hemoglobin 14.6 12.0 - 15.0 g/dL   HCT 42.7 36.0 - 46.0 %   MCV 88.7 78.0 - 100.0 fl   MCHC 34.3 30.0 - 36.0 g/dL   RDW 12.7 11.5 - 15.5 %  B12 and Folate Panel  Result Value Ref Range   Vitamin B-12 396 211 - 911 pg/mL   Folate >24.1 >5.9 ng/mL  Ferritin  Result Value Ref Range   Ferritin 31.4 10.0 - 291.0 ng/mL  Vitamin D (25 hydroxy)  Result Value Ref Range   VITD 40.47 30.00 - 100.00 ng/mL   Your blood counts are normal, no sign of anemia B12, folate, vitamin D normal Your iron level still on the low side, but is improving and in normal range.  I think you are okay to continue your current iron supplement since you are tolerating it well As we discussed, you might also try getting more dietary iron with red meat, fortified cereals, cast iron cooking  After you have taken your current level of iron for about 2 more months, I would decrease to 1 pill/day.  It is possible to develop excessive iron levels!  Please see me in about 6 months for recheck  Let me know if any questions or concerns

## 2019-05-23 ENCOUNTER — Encounter: Payer: Self-pay | Admitting: Family Medicine

## 2019-05-23 ENCOUNTER — Ambulatory Visit (INDEPENDENT_AMBULATORY_CARE_PROVIDER_SITE_OTHER): Payer: 59 | Admitting: Family Medicine

## 2019-05-23 ENCOUNTER — Other Ambulatory Visit: Payer: Self-pay

## 2019-05-23 VITALS — BP 118/74 | HR 94 | Temp 96.5°F | Resp 16 | Ht 67.0 in | Wt 145.0 lb

## 2019-05-23 DIAGNOSIS — Z23 Encounter for immunization: Secondary | ICD-10-CM | POA: Diagnosis not present

## 2019-05-23 DIAGNOSIS — E559 Vitamin D deficiency, unspecified: Secondary | ICD-10-CM | POA: Diagnosis not present

## 2019-05-23 DIAGNOSIS — D649 Anemia, unspecified: Secondary | ICD-10-CM | POA: Diagnosis not present

## 2019-05-23 DIAGNOSIS — J3089 Other allergic rhinitis: Secondary | ICD-10-CM | POA: Diagnosis not present

## 2019-05-23 DIAGNOSIS — E538 Deficiency of other specified B group vitamins: Secondary | ICD-10-CM | POA: Diagnosis not present

## 2019-05-23 LAB — B12 AND FOLATE PANEL
Folate: >24.1 ng/mL (ref >5.9)
Vitamin B-12: 396 pg/mL (ref 211–911)

## 2019-05-23 LAB — CBC
HCT: 42.7 % (ref 36.0–46.0)
Hemoglobin: 14.6 g/dL (ref 12.0–15.0)
MCHC: 34.3 g/dL (ref 30.0–36.0)
MCV: 88.7 fl (ref 78.0–100.0)
Platelets: 219 10*3/uL (ref 150.0–400.0)
RBC: 4.82 Mil/uL (ref 3.87–5.11)
RDW: 12.7 % (ref 11.5–15.5)
WBC: 6.2 10*3/uL (ref 4.0–10.5)

## 2019-05-23 LAB — FERRITIN: Ferritin: 31.4 ng/mL (ref 10.0–291.0)

## 2019-05-23 LAB — VITAMIN D 25 HYDROXY (VIT D DEFICIENCY, FRACTURES): VITD: 40.47 ng/mL (ref 30.00–100.00)

## 2019-05-23 MED ORDER — MONTELUKAST SODIUM 10 MG PO TABS: 10.0000 mg | ORAL_TABLET | Freq: Every day | ORAL | 3 refills | Status: DC

## 2019-05-23 MED FILL — MONTELUKAST SOD 10 MG TAB: 10 | 30 days supply | Qty: 30 | Fill #0

## 2019-05-23 NOTE — Patient Instructions (Signed)
Good to see you again today!  Take care and I will be in touch with your labs If your iron is still quite low, I can call in a higher dose rx and also touch base with hematology [id number] IV iron infusion

## 2019-07-05 ENCOUNTER — Encounter: Payer: Self-pay | Admitting: Family Medicine

## 2019-07-05 ENCOUNTER — Ambulatory Visit (INDEPENDENT_AMBULATORY_CARE_PROVIDER_SITE_OTHER)
Admission: RE | Admit: 2019-07-05 | Discharge: 2019-07-05 | Disposition: A | Discharge status: Home / Self Care | Payer: Managed Care, Other (non HMO) | Source: Ambulatory Visit

## 2019-07-05 DIAGNOSIS — R300 Dysuria: Secondary | ICD-10-CM

## 2019-07-05 DIAGNOSIS — N898 Other specified noninflammatory disorders of vagina: Secondary | ICD-10-CM

## 2019-07-05 MED ORDER — CEPHALEXIN 500 MG PO CAPS: 500.0000 mg | ORAL_CAPSULE | Freq: Three times a day (TID) | ORAL | 0 refills | Status: AC

## 2019-07-05 MED ORDER — FLUCONAZOLE 150 MG PO TABS: 150.0000 mg | ORAL_TABLET | Freq: Every day | ORAL | 0 refills | Status: DC

## 2019-07-05 NOTE — ED Provider Notes (Signed)
Virtual Visit via Video Note:  Kimberly Blankenship  initiated request for Telemedicine visit with [facility] Urgent Care team. I connected with Kimberly Blankenship  on 07/05/2019 at 5:48 PM  for a synchronized telemedicine visit using a video enabled HIPPA compliant telemedicine application. I verified that I am speaking with Kimberly Blankenship  using two identifiers. Sharion Balloon, NP  was physically located in a [facility] Urgent care site and Kimberly Blankenship was located at a different location.   The limitations of evaluation and management by telemedicine as well as the availability of in-person appointments were discussed. Patient was informed that she  may incur a bill ( including co-pay) for this virtual visit encounter. Kimberly Blankenship  expressed understanding and gave verbal consent to proceed with virtual visit.     History of Present Illness:Kimberly Blankenship  is a 34 y.o. female presents for evaluation of dysuria and urinary frequency since yesterday.  She also reports moderate white vaginal discharge and itching x3 to 4 days.  She denies rash, lesions, fever, chills, abdominal pain, back pain, pelvic pain, or other symptoms.  No treatments attempted at home.     Allergies  Allergen Reactions  . Sulfa Antibiotics Itching and Rash     Past Medical History:  Diagnosis Date  . Anxiety   . Depression   . Endometriosis determined by laparoscopy 04/25/2019  . GERD (gastroesophageal reflux disease)    occasional  . Headache, migraine   . IBS (irritable bowel syndrome)   . IDA (iron deficiency anemia)   . Left ovarian cyst      Social History   Tobacco Use  . Smoking status: Never Smoker  . Smokeless tobacco: Never Used  Substance Use Topics  . Alcohol use: Yes    Comment: occasionally  . Drug use: Never        Observations/Objective: Physical Exam  VITALS: Patient denies fever. GENERAL: Alert, appears well and in no acute distress. HEENT: Atraumatic. NECK: Normal movements of the head and  neck. CARDIOPULMONARY: No increased WOB. Speaking in clear sentences. I:E ratio WNL.  MS: Moves all visible extremities without noticeable abnormality. PSYCH: Pleasant and cooperative, well-groomed. Speech normal rate and rhythm. Affect is appropriate. Insight and judgement are appropriate. Attention is focused, linear, and appropriate.  NEURO: CN grossly intact. Oriented as arrived to appointment on time with no prompting. Moves both UE equally.  SKIN: No obvious lesions, wounds, erythema, or cyanosis noted on face or hands.   Assessment and Plan:    ICD-10-CM   1. Dysuria  R30.0   2. Vaginal discharge  N89.8        Follow Up Instructions: Treating with Keflex and Diflucan.  Instructed patient to follow-up with her primary care provider or come here to be seen in person if her symptoms are not improving in 2-3 day; sooner if her symptoms worsen.  Patient agrees to plan of care.      I discussed the assessment and treatment plan with the patient. The patient was provided an opportunity to ask questions and all were answered. The patient agreed with the plan and demonstrated an understanding of the instructions.   The patient was advised to call back or seek an in-person evaluation if the symptoms worsen or if the condition fails to improve as anticipated.      Sharion Balloon, NP  07/05/2019 5:48 PM         Sharion Balloon, NP 07/05/19 [phone]

## 2019-07-05 NOTE — Discharge Instructions (Addendum)
Take the cephalexin and fluconazole as directed.    Follow-up with your primary care provider or come here to be seen in person if your symptoms are not improving.

## 2019-08-06 ENCOUNTER — Ambulatory Visit (INDEPENDENT_AMBULATORY_CARE_PROVIDER_SITE_OTHER): Payer: Managed Care, Other (non HMO) | Admitting: Physician Assistant

## 2019-08-06 ENCOUNTER — Encounter: Payer: Self-pay | Admitting: Physician Assistant

## 2019-08-06 VITALS — Ht 67.0 in | Wt 145.0 lb

## 2019-08-06 DIAGNOSIS — K581 Irritable bowel syndrome with constipation: Secondary | ICD-10-CM | POA: Diagnosis not present

## 2019-08-06 MED ORDER — LUBIPROSTONE 8 MCG PO CAPS: 8.0000 ug | ORAL_CAPSULE | Freq: Two times a day (BID) | ORAL | 8 refills | Status: DC

## 2019-08-06 NOTE — Patient Instructions (Signed)
If you are age 32 or older, your body mass index should be between 23-30. Your Body mass index is 22.71 kg/m. If this is out of the aforementioned range listed, please consider follow up with your Primary Care Provider.  If you are age 68 or younger, your body mass index should be between 19-25. Your Body mass index is 22.71 kg/m. If this is out of the aformentioned range listed, please consider follow up with your Primary Care Provider.   We have sent the following medications to your pharmacy for you to pick up at your convenience: Amitiza 8 mcg twice daily for IBS-C.  Push water.  You have been given Low Gas Diet.   Call back in 2 weeks with an update.  May consider breath testing  Thank you for choosing me and [facility].   Amy Esterwood, PA-C

## 2019-08-06 NOTE — Progress Notes (Signed)
Subjective:    Patient ID: Kimberly Blankenship, female    DOB: 08-04-1985, 34 y.o.   MRN: [id number]  HPI   This service was provided via telemedicine.  Telephone call  The provider was located in provider's GI office. The patient did consent to this telephone visit and is aware of possible charges with her insurance for this visit. To persons participating in this telemedicine service were the patient and I. Time spent on call;15 min  Kimberly Blankenship is a pleasant 34 year old female, established with Dr. Hilarie Fredrickson, who had been diagnosed with IBS in the past.  When she was seen in 2019 she was having problems with diarrhea, abdominal cramping and bloating.  Initial celiac markers were negative, she had no response to IBgard or probiotics. She underwent colonoscopy in November 2019 which was normal including random biopsies.  Patient made visit today for issues with constipation and lower abdominal discomfort.  She had been given trials of Linzess and Motegrity in 2020 both without much benefit and Motegrity caused abdominal cramping and loose stools. Of interest patient does have history of endometriosis and underwent a diagnostic laparoscopy and fulguration of endometriosis and lysis of adhesions in November 2020.  She says she did okay with that surgery and did not start having any lower abdominal discomfort immediately after.  She says now over the past couple of months she has been having problems with abdominal bloating gassiness and what she describes as an achy stabbing type of pain in her lower abdomen which seems to be present when she is constipated.  She had been trying to manage her symptoms with fiber etc. but has noticed increase in lower abdominal discomfort and pressure in the mid to lower abdomen.  She thinks this is related to constipation.  She started taking MiraLAX for couple of days and had been able to move her bowels with soft bowel movements but then goes back to constipation and recurrent  lower abdominal discomfort.  She says she is also been having an increase in GERD symptoms recently. She has not been using any artificial sweeteners, no carbonates.  She has been using Tums and some digestive enzymes for GERD symptoms. Today she is asking about testing for lactose intolerance and SIBO.  She had done some reading about both.  She also asked about food allergy testing.     Review of Systems Pertinent positive and negative review of systems were noted in the above HPI section.  All other review of systems was otherwise negative.  Outpatient Encounter Medications as of 08/06/2019  Medication Sig   ACETAMINOPHEN-BUTALBITAL 50-325 MG TABS May take 1-2 tablets every 4 hours as needed for headache. Max 6 per 24 hours   ALPRAZolam (XANAX) 0.25 MG tablet Take 1 tablet (0.25 mg total) by mouth 2 (two) times daily as needed for anxiety.   Ascorbic Acid (VITAMIN C) 500 MG CAPS Take by mouth daily.   calcium carbonate (TUMS - DOSED IN MG ELEMENTAL CALCIUM) 500 MG chewable tablet Chew 1 tablet by mouth as needed for indigestion or heartburn.   cetirizine (ZYRTEC) 10 MG tablet Take 10 mg by mouth daily.   Cholecalciferol (VITAMIN D3) 5000 units CAPS Take 1 capsule by mouth daily.   ferrous sulfate 325 (65 FE) MG EC tablet Take 325 mg by mouth daily.   FIBER ADULT GUMMIES PO Take by mouth as needed.   Menaquinone-7 (VITAMIN K2) 100 MCG CAPS Take 1 tablet by mouth daily.   montelukast (SINGULAIR) 10 MG tablet Take  1 tablet (10 mg total) by mouth at bedtime.   vitamin B-12 (CYANOCOBALAMIN) 1000 MCG tablet Take 1,000 mcg by mouth daily.   lubiprostone (AMITIZA) 8 MCG capsule Take 1 capsule (8 mcg total) by mouth 2 (two) times daily with a meal. Take twice daily for  IBS - C   [DISCONTINUED] fluconazole (DIFLUCAN) 150 MG tablet Take 1 tablet (150 mg total) by mouth daily. Take one tablet today.  May repeat in 3 days.   [DISCONTINUED] levocetirizine (XYZAL) 5 MG tablet Take 1 tablet  (5 mg total) by mouth every evening.   [DISCONTINUED] tretinoin (RETIN-A) 0.025 % cream Apply 1 application topically daily.   No facility-administered encounter medications on file as of 08/06/2019.   Allergies  Allergen Reactions   Sulfa Antibiotics Itching and Rash   Patient Active Problem List   Diagnosis Date Noted   Endometriosis determined by laparoscopy 04/25/2019   Dermoid cyst of left ovary 01/08/2019   Hair loss disorder 01/08/2019   Elevated testosterone level in female 01/08/2019   Migraine 12/26/2018   Skin cancer screening 05/28/2018   Anemia 11/03/2017   Metrorrhagia 11/01/2017   GERD (gastroesophageal reflux disease) 04/29/2017   Abnormality of immune system 03/24/2017   Low testosterone level in female 03/24/2017   Panic attacks 02/01/2017   B12 deficiency 01/30/2017   Low blood sugar 01/18/2017   Great toe pain, left 11/03/2016   Fatigue 06/20/2016   Allergy to cockroaches 12/30/2015   GAD (generalized anxiety disorder) 02/18/2014   Chronic neck pain 01/23/2014   Vitamin D deficiency 01/23/2014   Oral contraceptive use 12/25/2012   Allergic rhinitis 06/22/2012   Chronic tension headaches 06/22/2012   Social History   Socioeconomic History   Marital status: Married    Spouse name: Shanon Brow   Number of children: 0   Years of education: Not on file   Highest education level: Associate degree: academic program  Occupational History   Occupation: admin assist  Tobacco Use   Smoking status: Never Smoker   Smokeless tobacco: Never Used  Substance and Sexual Activity   Alcohol use: Yes    Comment: occasionally   Drug use: Never   Sexual activity: Yes    Birth control/protection: None  Other Topics Concern   Not on file  Social History Narrative   Patient is right-handed. She livew with her husband in a 2 story home. She drinks 1-2 cups of coffee and 1 cup of tea a day. She does not exercise.    Social Determinants  of Health   Financial Resource Strain:    Difficulty of Paying Living Expenses: Not on file  Food Insecurity:    Worried About Charity fundraiser in the Last Year: Not on file   [employer] of Food in the Last Year: Not on file  Transportation Needs:    Lack of Transportation (Medical): Not on file   Lack of Transportation (Non-Medical): Not on file  Physical Activity:    Days of Exercise per Week: Not on file   Minutes of Exercise per Session: Not on file  Stress:    Feeling of Stress : Not on file  Social Connections:    Frequency of Communication with Friends and Family: Not on file   Frequency of Social Gatherings with Friends and Family: Not on file   Attends Religious Services: Not on file   Active Member of Clubs or Organizations: Not on file   Attends Archivist Meetings: Not on file  Marital Status: Not on file  Intimate Partner Violence:    Fear of Current or Ex-Partner: Not on file   Emotionally Abused: Not on file   Physically Abused: Not on file   Sexually Abused: Not on file    Kimberly Blankenship's family history includes Arthritis in her maternal grandmother, mother, and paternal grandmother; COPD in her paternal grandmother; Depression in her brother; Diabetes in her mother; Fibroids in her maternal grandmother and mother; Hypertension in her maternal grandfather; Miscarriages / [place] in her mother; Other in her paternal grandfather; Stomach cancer in her maternal grandfather; Uterine cancer in her paternal aunt.      Objective:    There were no vitals filed for this visit.  Physical Exam-no exam today/virtual visit       Assessment & Plan:   #86 34 year old female with history of IBS.  In the past she had had issues with diarrhea and is now having ongoing issues with constipation over the past several months.  In addition she has been having intermittent lower abdominal pain/pressure. No response to Linzess in the past, intolerant to  Motegrity, no response to IBgard.  MiraLAX is somewhat helpful.  I think her symptoms are secondary to IBS/C.  Prior colonoscopy November 2019 entirely normal including random biopsies.  Patient does have history of endometriosis and had recent laparoscopy with lysis of adhesions and fulguration of endometriosis.  Certainly may have component of lower abdominal pain secondary to endometriosis and/or adhesions.  #2 GERD-mild recent  Plan; start low gas diet. Advised trial of Pepcid AC 1-2 p.o. twice daily on a regular basis over the next couple of weeks for GERD symptoms. We will give her a trial of Amitiza 8 mcg p.o. twice daily.  I have asked her to try this for a couple of weeks and then call back with an update.  We can titrate up on dose, or try another product if not beneficial. She was also advised that she could continue to use MiraLAX on a as needed basis. We will see how she responds to Amitiza.  If no improvement in symptoms, then she would like to proceed with breath testing for SIBO and lactose intolerance.  Kimberly Blankenship S Esiah Bazinet PA-C 08/06/2019   Cc: Copland, Gay Filler, MD

## 2019-08-07 NOTE — Progress Notes (Signed)
Addendum: Reviewed and agree with assessment and management plan. Alexys Lobello M, MD  

## 2019-08-09 ENCOUNTER — Telehealth: Payer: Self-pay

## 2019-08-09 NOTE — Telephone Encounter (Signed)
Amitiza was denied despite already failing Linzess. The preferred are Linzess, Movantik and Symproic. Please advise if you would like an appeal to be sent in or to change to one of the other medications?

## 2019-08-09 NOTE — Telephone Encounter (Signed)
Prior Authorization has been submitted for patient's Amitiza through CoverMyMeds. Currently awaiting response of approval.

## 2019-08-12 NOTE — Telephone Encounter (Signed)
I do not want to use Movantik or Symproic as these are for opioid-induced constipation.  She does not use opioids but has IBS-C. Would prefer to give her a trial of Amitiza as above, if they will not approve Amitiza could try Trulance or Motegrity.  Let us try to appeal for Amitiza first

## 2019-08-12 NOTE — Telephone Encounter (Signed)
An appeal has been started

## 2019-08-13 MED FILL — ALPRAZolam 0.25 MG TABS: 0.25 | 15 days supply | Qty: 30 | Fill #1

## 2019-08-13 MED FILL — LUBIPROSTONE 8 MCG CAPS: 8 | 30 days supply | Qty: 60 | Fill #0

## 2019-08-13 NOTE — Telephone Encounter (Signed)
Patient advised that Amitiza has been approved.

## 2019-08-13 NOTE — Telephone Encounter (Signed)
The appeal was approved for Amitiza 08/13/19-08/12/20.

## 2019-08-13 NOTE — Telephone Encounter (Signed)
Great, please notify patient and resend prescription to her pharmacy thanks

## 2019-09-12 ENCOUNTER — Telehealth: Payer: Self-pay | Admitting: Physician Assistant

## 2019-09-13 ENCOUNTER — Telehealth: Payer: Self-pay | Admitting: Physician Assistant

## 2019-09-13 MED FILL — LUBIPROSTONE 8 MCG CAPS: 8 | 30 days supply | Qty: 60 | Fill #1

## 2019-09-13 NOTE — Telephone Encounter (Signed)
Patient wants to do just the Lactose test and not the SIBO with lactulose test first. She will have to pay the money up front per test.

## 2019-09-13 NOTE — Telephone Encounter (Signed)
See the patient messages.

## 2019-09-21 ENCOUNTER — Ambulatory Visit: Payer: Managed Care, Other (non HMO) | Attending: Internal Medicine

## 2019-09-21 DIAGNOSIS — Z23 Encounter for immunization: Secondary | ICD-10-CM

## 2019-09-21 NOTE — Progress Notes (Signed)
   Covid-19 Vaccination Clinic  Name:  Kimberly Blankenship    MRN: [id number] DOB: [birth year]  09/21/2019  [name] was observed post Covid-19 immunization for 15 minutes without incident. She was provided with Vaccine Information Sheet and instruction to access the V-Safe system.   [name] was instructed to call 911 with any severe reactions post vaccine: Marland Kitchen Difficulty breathing  . Swelling of face and throat  . A fast heartbeat  . A bad rash all over body  . Dizziness and weakness   Immunizations Administered    Name Date Dose VIS Date Route   Pfizer COVID-19 Vaccine 09/21/2019 11:24 AM 0.3 mL 05/31/2019 Intramuscular   Manufacturer: [place]   Lot: [id number]   [place]: [id number]

## 2019-09-26 ENCOUNTER — Encounter: Payer: Self-pay | Admitting: Physician Assistant

## 2019-09-27 LAB — IRON,TIBC AND FERRITIN PANEL
Iron: 118
TIBC: 273
UIBC: 155

## 2019-10-02 ENCOUNTER — Telehealth: Payer: Self-pay | Admitting: Physician Assistant

## 2019-10-02 NOTE — Telephone Encounter (Signed)
Kimberly Blankenship with [facility] called to inform that they faxed over lactose intolerance report. He stated that there is a footnote on the report that indicates that pt might have SIBO. If there is any questions, he can be reached at [phone].

## 2019-10-03 NOTE — Telephone Encounter (Signed)
I reviewed the report , negative for lactose intolerance , but suggestive of SIBO- not diagnostic - I  would offer empiric treatment for possible SIBO with a 14 day course of Xifaxan 550 mg po TID- send through [facility] to hepl get covered  thanks

## 2019-10-04 ENCOUNTER — Other Ambulatory Visit: Payer: Self-pay

## 2019-10-04 MED ORDER — XIFAXAN 550 MG PO TABS: 550.0000 mg | ORAL_TABLET | Freq: Three times a day (TID) | ORAL | 0 refills | Status: AC

## 2019-10-04 NOTE — Telephone Encounter (Signed)
Discussed with the patient.  Rx transmitted to [facility].

## 2019-10-11 ENCOUNTER — Encounter: Payer: Self-pay | Admitting: Family Medicine

## 2019-10-11 MED ORDER — BUTALBITAL-ACETAMINOPHEN 50-325 MG PO TABS: ORAL_TABLET | ORAL | 0 refills | Status: DC

## 2019-10-11 MED FILL — BUTALBITAL/ACETAMINOPHEN 50: 50-325 | 5 days supply | Qty: 30 | Fill #0

## 2019-10-15 ENCOUNTER — Ambulatory Visit: Payer: Managed Care, Other (non HMO) | Attending: Internal Medicine

## 2019-10-15 DIAGNOSIS — Z23 Encounter for immunization: Secondary | ICD-10-CM

## 2019-10-15 NOTE — Progress Notes (Signed)
   Covid-19 Vaccination Clinic  Name:  Kimberly Blankenship    MRN: [id number] DOB: 08-13-1985  10/15/2019  [name] was observed post Covid-19 immunization for 15 minutes without incident. She was provided with Vaccine Information Sheet and instruction to access the V-Safe system.   [name] was instructed to call 911 with any severe reactions post vaccine: Marland Kitchen Difficulty breathing  . Swelling of face and throat  . A fast heartbeat  . A bad rash all over body  . Dizziness and weakness   Immunizations Administered    Name Date Dose VIS Date Route   Pfizer COVID-19 Vaccine 10/15/2019 10:28 AM 0.3 mL 08/14/2018 Intramuscular   Manufacturer: [place]   Lot: [id number]   [place]: [id number]

## 2019-10-16 ENCOUNTER — Telehealth: Payer: Self-pay | Admitting: Physician Assistant

## 2019-10-16 NOTE — Telephone Encounter (Signed)
The patient is getting ready to start taking the Xifaxan that was prescribed to her for empiric treatment of SIBO.  Does she continue to take Amitiza? Are the vitamins B12, D and magnesium okay to take while on Xifaxan?

## 2019-10-17 NOTE — Telephone Encounter (Signed)
Patient is advised.  

## 2019-10-17 NOTE — Telephone Encounter (Signed)
Ok to take all her usual meds /supplements while on Xifaxan

## 2019-10-18 ENCOUNTER — Encounter: Payer: Self-pay | Admitting: Family Medicine

## 2019-10-22 ENCOUNTER — Encounter: Payer: Self-pay | Admitting: Family Medicine

## 2019-10-24 MED FILL — BUTALBITAL/ACETAMINOPHEN 50: 50-325 | 5 days supply | Qty: 30 | Fill #0

## 2019-10-25 ENCOUNTER — Other Ambulatory Visit: Payer: Self-pay

## 2019-10-29 ENCOUNTER — Ambulatory Visit (INDEPENDENT_AMBULATORY_CARE_PROVIDER_SITE_OTHER): Payer: Managed Care, Other (non HMO) | Admitting: Internal Medicine

## 2019-10-29 ENCOUNTER — Other Ambulatory Visit: Payer: Self-pay

## 2019-10-29 ENCOUNTER — Encounter: Payer: Self-pay | Admitting: Internal Medicine

## 2019-10-29 VITALS — BP 122/72 | HR 84 | Temp 98.4°F | Ht 67.0 in | Wt 146.2 lb

## 2019-10-29 DIAGNOSIS — R7989 Other specified abnormal findings of blood chemistry: Secondary | ICD-10-CM | POA: Diagnosis not present

## 2019-10-29 NOTE — Progress Notes (Signed)
Name: Kimberly Blankenship  MRN/ DOB: [id number], 18-Sep-1985    Age/ Sex: 34 y.o., female     PCP: Blankenship, Kimberly Filler, MD   Reason for Endocrinology Evaluation: Elevated testosterone      Initial Endocrinology Clinic Visit: 01/07/2019    PATIENT IDENTIFIER: [name] is a 34 y.o., female with a past medical history of GERD, Migraine headaches and anxiety. She has followed with [facility] since 01/07/2019 for consultative assistance with management of her elevated testosterone.   HISTORICAL SUMMARY:   The  patient indicates that she was first diagnosed with elevated Testosterone in 12/2018, at which time she was having hair loss that has been ongoing since 07/2018. Work-up for this issue has thus far have included biochemical testing showing elevated testosterone. She has also been diagnosed with a left ovarian dermoid cyst  in 2019 , S/P laparoscopic sx in 04/2019 with adhesions and endometriosis .  The patient had clinical symptoms of hyperandrogenism such as increased acne that started ~ 2018  DHEAS has been normal Testosterone elevated at 59.1 ng/dL ( 10-55)   SUBJECTIVE:    Today (10/29/2019):  Kimberly Blankenship is here for a follow up on elevated testosterone.  She is S/p laparoscopic excision of adhesions and cauterized endometriosis   Has noted some improvement in acne and hair loss.  Anemia has resolved   Denies changes in voice or any virilization symptoms  No hirsutism       ROS:  As per HPI.   HISTORY:  Past Medical History:  Past Medical History:  Diagnosis Date  . Anxiety   . Depression   . Endometriosis determined by laparoscopy 04/25/2019  . GERD (gastroesophageal reflux disease)    occasional  . Headache, migraine   . IBS (irritable bowel syndrome)   . IDA (iron deficiency anemia)   . Left ovarian cyst    Past Surgical History:  Past Surgical History:  Procedure Laterality Date  . LYSIS OF ADHESION N/A 04/25/2019   Procedure: LYSIS OF  ADHESION, FULGURATION OF ENDOMETRIOSIS;  Surgeon: Sherlyn Hay, DO;  Location: [facility];  Service: Gynecology;  Laterality: N/A;  . NO PAST SURGERIES    . wisdom teeth      Social History:  reports that she has never smoked. She has never used smokeless tobacco. She reports current alcohol use. She reports that she does not use drugs. Family History:  Family History  Problem Relation Age of Onset  . Arthritis Mother   . Diabetes Mother   . Miscarriages / [place] Mother   . Fibroids Mother   . Depression Brother   . Arthritis Maternal Grandmother   . Fibroids Maternal Grandmother   . Hypertension Maternal Grandfather   . Stomach cancer Maternal Grandfather   . Arthritis Paternal Grandmother   . COPD Paternal Grandmother   . Other Paternal Grandfather        Brain tumor  . Uterine cancer Paternal Aunt   . Colon cancer Neg Hx   . Esophageal cancer Neg Hx   . Pancreatic cancer Neg Hx   . Liver disease Neg Hx      HOME MEDICATIONS: Allergies as of 10/29/2019      Reactions   Sulfa Antibiotics Itching, Rash      Medication List       Accurate as of Oct 29, 2019  3:43 PM. If you have any questions, ask your nurse or doctor.        ACETAMINOPHEN-BUTALBITAL 50-325  MG Tabs May take 1-2 tablets every 4 hours as needed for headache. Max 6 per 24 hours   ALPRAZolam 0.25 MG tablet Commonly known as: XANAX Take 1 tablet (0.25 mg total) by mouth 2 (two) times daily as needed for anxiety.   calcium carbonate 500 MG chewable tablet Commonly known as: TUMS - dosed in mg elemental calcium Chew 1 tablet by mouth as needed for indigestion or heartburn.   cetirizine 10 MG tablet Commonly known as: ZYRTEC Take 10 mg by mouth daily.   ferrous sulfate 325 (65 FE) MG EC tablet Take 325 mg by mouth daily.   FIBER ADULT GUMMIES PO Take by mouth as needed.   lubiprostone 8 MCG capsule Commonly known as: Amitiza Take 1 capsule (8 mcg total) by mouth 2  (two) times daily with a meal. Take twice daily for  IBS - C   montelukast 10 MG tablet Commonly known as: SINGULAIR Take 1 tablet (10 mg total) by mouth at bedtime.   vitamin B-12 1000 MCG tablet Commonly known as: CYANOCOBALAMIN Take 1,000 mcg by mouth daily.   Vitamin C 500 MG Caps Take by mouth daily.   Vitamin D3 125 MCG (5000 UT) Caps Take 1 capsule by mouth daily.   Vitamin K2 100 MCG Caps Take 1 tablet by mouth daily.   Xifaxan 550 MG Tabs tablet Generic drug: rifaximin Xifaxan 550 mg tablet         OBJECTIVE:   PHYSICAL EXAM: VS: BP 122/72 (BP Location: Left Arm, Patient Position: Sitting, Cuff Size: Normal)   Pulse 84   Temp 98.4 F (36.9 C)   Ht 5\' 7"  (1.702 m)   Wt 146 lb 3.2 oz (66.3 kg)   SpO2 98%   BMI 22.90 kg/m    EXAM: General: Pt appears well and is in NAD  Neck: General: Supple without adenopathy. Thyroid: Thyroid size normal.  No goiter or nodules appreciated. No thyroid bruit.  Lungs: Clear with good BS bilat with no rales, rhonchi, or wheezes  Heart: Auscultation: RRR.  Abdomen: Normoactive bowel sounds, soft, nontender, without masses or organomegaly palpable  Extremities:  BL LE: No pretibial edema normal ROM and strength.  Skin: Hair: Texture and amount normal with gender appropriate distribution Skin Inspection: No rashes Skin Palpation: Skin temperature, texture, and thickness normal to palpation  Neuro: Cranial nerves: II - XII grossly intact  Motor: Normal strength throughout DTRs: 2+ and symmetric in UE without delay in relaxation phase  Mental Status: Judgment, insight: Intact Orientation: Oriented to time, place, and person Mood and affect: No depression, anxiety, or agitation     DATA REVIEWED:  Results for Kimberly, Blankenship (MRN [id number]) as of 10/29/2019 15:42  Ref. Range 01/08/2019 08:16  17-Hydroxyprogesterone Latest Units: ng/dL 151  LH Latest Units: mIU/mL 17.0  FSH Latest Units: mIU/mL 5.9  Prolactin Latest Ref  Range: 4.8 - 23.3 ng/mL 13.2  Glucose Latest Ref Range: 70 - 99 mg/dL 80  Testosterone, total Latest Ref Range: 10.0 - 55.0 ng/dL 59.1 (H)  Testosterone, Serum (Total) Latest Units: ng/dL 50  % Free Testosterone Latest Units: % 0.5  Free Testosterone, S Latest Units: pg/mL 2.5  Sex Hormone Binding Globulin Latest Units: nmol/L 111.4     ASSESSMENT / PLAN / RECOMMENDATIONS:   1. Elevated Testosterone:   - Pt with no androgenic features  - hair loss and acne are improving , no hirsutism - Historically testosterone is minimally elevated  - We again discussed the first line for elevated  testosterone is combined oral contraceptive pills but after discussing the risk and the benefit, she opted to avoid any treatment as her symptoms are not distressing enough.   - Will repeat testosterone today  - Pt will contact us should her symptoms worsen  F/U PRN    Signed electronically by: Mack Guise, MD  [facility]  [facility] [address] [place], [place] [address] Phone: [phone] FAX: [phone]      CC: [address], [address] [place] [address] Phone: [phone]  Fax: [phone]   Return to Endocrinology clinic as below: No future appointments.

## 2019-10-31 LAB — TESTOSTERONE, TOTAL, LC/MS/MS: Testosterone, Total, LC-MS-MS: 35 ng/dL (ref 2–45)

## 2019-11-04 ENCOUNTER — Encounter: Payer: Self-pay | Admitting: Family Medicine

## 2019-11-05 NOTE — Progress Notes (Signed)
[facility] [address], [place], [place] [address] [id number] [phone]  Date:  11/06/2019   Name:  Kimberly Blankenship   DOB:  Jun 17, 1986   MRN:  [id number]  PCP:  Darreld Mclean, MD    Chief Complaint: No chief complaint on file.   History of Present Illness:  Kimberly Blankenship is a 34 y.o. very pleasant female patient who presents with the following:  Virtual visit today to discuss alprazolam for anxiety We originally planned in person visit, but the patient was able to have her ear issue attended to urgent care-therefore we were able to change to telephone visit for patient convenience  Patient location is home, provider location is office.  Patient and in for review factors, she gives consent for virtual visit today.  Patient and myself are present on call  Kimberly Blankenship has history of migraine headache, allergies, GERD, anxiety and panic attacks, IBS she sees [facility] [employer] with [facility] Last seen by myself in December  She uses alprazolam as needed for anxiety-does not use very frequently, generally no more than 2-3 times a week She has noted herself needing her xanax- she uses it mostly in the am when she feels anxious Not having panic attacks per se She does not feel like she is depressed; however, she has thought about starting on SSRI for more baseline anxiety.  She wonders if a particular medicine would be more beneficial given her history of IBS  No concern of self-harm risk   COVID-19 series is complete   08/13/2019  1   03/28/2019  Alprazolam 0.25 MG Tablet  30.00  15 Je Cop   [id number]   Med (5269)   1  1.00 LME  Comm Ins   [place]  04/25/2019  1   04/25/2019  Oxycodone-Acetaminophen 5-325  20.00  7 Ce Ban   [id number]   Med (5269)   0  21.43 MME  Comm Ins   [place]  03/28/2019  1   03/28/2019  Alprazolam 0.25 MG Tablet  30.00  15 Je Cop   [id number]   Med (5269)   0  1.00 LME  Comm Ins     07/31/2018  1   07/31/2018  Alprazolam  0.25 MG Tablet  30.00  15 Je Cop   [id number]   Med (5269)   0  1.00 LME  Comm Ins     02/22/2018  1   02/22/2018  Alprazolam 0.25 MG Tablet  30.00  15 Je Cop   [id number]   Med (5269)   0  1.00 LME         Patient Active Problem List   Diagnosis Date Noted  . Endometriosis determined by laparoscopy 04/25/2019  . Dermoid cyst of left ovary 01/08/2019  . Hair loss disorder 01/08/2019  . Elevated testosterone level in female 01/08/2019  . Migraine 12/26/2018  . Skin cancer screening 05/28/2018  . Anemia 11/03/2017  . Metrorrhagia 11/01/2017  . GERD (gastroesophageal reflux disease) 04/29/2017  . Abnormality of immune system 03/24/2017  . Low testosterone level in female 03/24/2017  . Panic attacks 02/01/2017  . B12 deficiency 01/30/2017  . Low blood sugar 01/18/2017  . Great toe pain, left 11/03/2016  . Fatigue 06/20/2016  . Allergy to cockroaches 12/30/2015  . GAD (generalized anxiety disorder) 02/18/2014  . Chronic neck pain 01/23/2014  . Vitamin D deficiency 01/23/2014  . Oral contraceptive use 12/25/2012  . Allergic rhinitis 06/22/2012  .  Chronic tension headaches 06/22/2012    Past Medical History:  Diagnosis Date  . Anxiety   . Depression   . Endometriosis determined by laparoscopy 04/25/2019  . GERD (gastroesophageal reflux disease)    occasional  . Headache, migraine   . IBS (irritable bowel syndrome)   . IDA (iron deficiency anemia)   . Left ovarian cyst     Past Surgical History:  Procedure Laterality Date  . LYSIS OF ADHESION N/A 04/25/2019   Procedure: LYSIS OF ADHESION, FULGURATION OF ENDOMETRIOSIS;  Surgeon: Sherlyn Hay, DO;  Location: [facility];  Service: Gynecology;  Laterality: N/A;  . NO PAST SURGERIES    . wisdom teeth      Social History   Tobacco Use  . Smoking status: Never Smoker  . Smokeless tobacco: Never Used  Substance Use Topics  . Alcohol use: Yes    Comment: occasionally  . Drug use: Never    Family  History  Problem Relation Age of Onset  . Arthritis Mother   . Diabetes Mother   . Miscarriages / [place] Mother   . Fibroids Mother   . Depression Brother   . Arthritis Maternal Grandmother   . Fibroids Maternal Grandmother   . Hypertension Maternal Grandfather   . Stomach cancer Maternal Grandfather   . Arthritis Paternal Grandmother   . COPD Paternal Grandmother   . Other Paternal Grandfather        Brain tumor  . Uterine cancer Paternal Aunt   . Colon cancer Neg Hx   . Esophageal cancer Neg Hx   . Pancreatic cancer Neg Hx   . Liver disease Neg Hx     Allergies  Allergen Reactions  . Sulfa Antibiotics Itching and Rash    Medication list has been reviewed and updated.  Current Outpatient Medications on File Prior to Visit  Medication Sig Dispense Refill  . ACETAMINOPHEN-BUTALBITAL 50-325 MG TABS May take 1-2 tablets every 4 hours as needed for headache. Max 6 per 24 hours 30 tablet 0  . ALPRAZolam (XANAX) 0.25 MG tablet Take 1 tablet (0.25 mg total) by mouth 2 (two) times daily as needed for anxiety. 30 tablet 1  . Ascorbic Acid (VITAMIN C) 500 MG CAPS Take by mouth daily.    . calcium carbonate (TUMS - DOSED IN MG ELEMENTAL CALCIUM) 500 MG chewable tablet Chew 1 tablet by mouth as needed for indigestion or heartburn.    . cetirizine (ZYRTEC) 10 MG tablet Take 10 mg by mouth daily.    . Cholecalciferol (VITAMIN D3) 5000 units CAPS Take 1 capsule by mouth daily.    . ferrous sulfate 325 (65 FE) MG EC tablet Take 325 mg by mouth daily.    Marland Kitchen FIBER ADULT GUMMIES PO Take by mouth as needed.    . lubiprostone (AMITIZA) 8 MCG capsule Take 1 capsule (8 mcg total) by mouth 2 (two) times daily with a meal. Take twice daily for  IBS - C 60 capsule 8  . Menaquinone-7 (VITAMIN K2) 100 MCG CAPS Take 1 tablet by mouth daily.    . montelukast (SINGULAIR) 10 MG tablet Take 1 tablet (10 mg total) by mouth at bedtime. 30 tablet 3  . rifaximin (XIFAXAN) 550 MG TABS tablet Xifaxan 550 mg  tablet    . vitamin B-12 (CYANOCOBALAMIN) 1000 MCG tablet Take 1,000 mcg by mouth daily.     No current facility-administered medications on file prior to visit.    Review of Systems:  As per HPI-  otherwise negative.   Physical Examination: There were no vitals filed for this visit. There were no vitals filed for this visit. There is no height or weight on file to calculate BMI. Ideal Body Weight:    Spoke with pt on telephone- she sounds well  No cough, wheezing, distress is noted  Assessment and Plan: GAD (generalized anxiety disorder) - Plan: ALPRAZolam (XANAX) 0.25 MG tablet  Panic attack - Plan: ALPRAZolam (XANAX) 0.25 MG tablet  Telephone visit today to discuss anxiety.  Patient uses alprazolam on occasion, I reviewed her controlled substance database and refilled her prescription.  I have also sent a message to her gastroenterology provider, Nicoletta Ba, about any particular SSRI preference.  The patient would potentially be interested in starting an SSRI Spoke with pt for about 7 minutes today  Signed Lamar Blinks, MD

## 2019-11-06 ENCOUNTER — Telehealth (INDEPENDENT_AMBULATORY_CARE_PROVIDER_SITE_OTHER): Payer: Managed Care, Other (non HMO) | Admitting: Family Medicine

## 2019-11-06 ENCOUNTER — Other Ambulatory Visit: Payer: Self-pay

## 2019-11-06 DIAGNOSIS — F411 Generalized anxiety disorder: Secondary | ICD-10-CM

## 2019-11-06 DIAGNOSIS — F410 Panic disorder [episodic paroxysmal anxiety] without agoraphobia: Secondary | ICD-10-CM | POA: Diagnosis not present

## 2019-11-06 MED ORDER — ALPRAZOLAM 0.25 MG PO TABS: 0.2500 mg | ORAL_TABLET | Freq: Two times a day (BID) | ORAL | 1 refills | Status: DC | PRN

## 2019-11-06 MED FILL — ALPRAZolam 0.25 MG TABS: 0.25 | 15 days supply | Qty: 30 | Fill #0

## 2019-11-11 ENCOUNTER — Encounter: Payer: Self-pay | Admitting: Family Medicine

## 2019-11-11 DIAGNOSIS — F411 Generalized anxiety disorder: Secondary | ICD-10-CM

## 2019-11-12 MED ORDER — FLUOXETINE HCL 20 MG PO CAPS: 20.0000 mg | ORAL_CAPSULE | Freq: Every day | ORAL | 5 refills | Status: DC

## 2019-11-12 MED FILL — FLUoxetine HCL 20 MG CAPS: 20 | 28 days supply | Qty: 42 | Fill #0

## 2020-01-20 NOTE — Progress Notes (Addendum)
[facility] [address], [place], [place] [address] [phone]  Date:  01/27/2020   Name:  Kimberly Blankenship   DOB:  [birth year]   MRN:  [id number]  PCP:  Darreld Mclean, MD    Chief Complaint: Annual Exam   History of Present Illness:  Kimberly Blankenship is a 34 y.o. very pleasant female patient who presents with the following:  Here today for routine CPE History of allergies, migraine HA, gerd, B12 deficiency, elevated T level  Last seen by myself for a video visit in May, for concern of anxiety Her anxiety is much better. She is doing well on 20 mg of prozac.  She did not end up increasing to 40 mg, she is satisfied with 20 mg She sees endocrinology regarding her elevated T level   Pap- UTD, can update if she likes.  Last screening did not have HPV- she sees GYN however, plans to do Pap with her gynecologist at [facility] and HIV screening can be done  covid series done  Can offer B12 and D levels if she likes  She is fasting this am   She is working part time and from home right now.  This is working out well for her Her family is ok Her husband is feeling better   She does not get as much exercise as she would like  Gained some weight during the pandemic but lost it again Never smoker  Wt Readings from Last 3 Encounters:  01/27/20 142 lb (64.4 kg)  10/29/19 146 lb 3.2 oz (66.3 kg)  08/06/19 145 lb (65.8 kg)     She is seeing her GI this afternoon- she uses [facility]  Patient Active Problem List   Diagnosis Date Noted  . Endometriosis determined by laparoscopy 04/25/2019  . Dermoid cyst of left ovary 01/08/2019  . Hair loss disorder 01/08/2019  . Elevated testosterone level in female 01/08/2019  . Migraine 12/26/2018  . Skin cancer screening 05/28/2018  . Anemia 11/03/2017  . Metrorrhagia 11/01/2017  . GERD (gastroesophageal reflux disease) 04/29/2017  . Abnormality of immune system  03/24/2017  . Low testosterone level in female 03/24/2017  . Panic attacks 02/01/2017  . B12 deficiency 01/30/2017  . Low blood sugar 01/18/2017  . Great toe pain, left 11/03/2016  . Fatigue 06/20/2016  . Allergy to cockroaches 12/30/2015  . GAD (generalized anxiety disorder) 02/18/2014  . Chronic neck pain 01/23/2014  . Vitamin D deficiency 01/23/2014  . Oral contraceptive use 12/25/2012  . Allergic rhinitis 06/22/2012  . Chronic tension headaches 06/22/2012    Past Medical History:  Diagnosis Date  . Anxiety   . Depression   . Endometriosis determined by laparoscopy 04/25/2019  . GERD (gastroesophageal reflux disease)    occasional  . Headache, migraine   . IBS (irritable bowel syndrome)   . IDA (iron deficiency anemia)   . Left ovarian cyst     Past Surgical History:  Procedure Laterality Date  . LYSIS OF ADHESION N/A 04/25/2019   Procedure: LYSIS OF ADHESION, FULGURATION OF ENDOMETRIOSIS;  Surgeon: Sherlyn Hay, DO;  Location: [facility];  Service: Gynecology;  Laterality: N/A;  . NO PAST SURGERIES    . wisdom teeth      Social History   Tobacco Use  . Smoking status: Never Smoker  . Smokeless tobacco: Never Used  Vaping Use  . Vaping Use: Never used  Substance  Use Topics  . Alcohol use: Yes    Comment: occasionally  . Drug use: Never    Family History  Problem Relation Age of Onset  . Arthritis Mother   . Diabetes Mother   . Miscarriages / [place] Mother   . Fibroids Mother   . Depression Brother   . Arthritis Maternal Grandmother   . Fibroids Maternal Grandmother   . Hypertension Maternal Grandfather   . Stomach cancer Maternal Grandfather   . Arthritis Paternal Grandmother   . COPD Paternal Grandmother   . Other Paternal Grandfather        Brain tumor  . Uterine cancer Paternal Aunt   . Colon cancer Neg Hx   . Esophageal cancer Neg Hx   . Pancreatic cancer Neg Hx   . Liver disease Neg Hx     Allergies  Allergen  Reactions  . Sulfa Antibiotics Itching and Rash    Medication list has been reviewed and updated.  Current Outpatient Medications on File Prior to Visit  Medication Sig Dispense Refill  . ACETAMINOPHEN-BUTALBITAL 50-325 MG TABS May take 1-2 tablets every 4 hours as needed for headache. Max 6 per 24 hours 30 tablet 0  . ALPRAZolam (XANAX) 0.25 MG tablet Take 1 tablet (0.25 mg total) by mouth 2 (two) times daily as needed for anxiety. 30 tablet 1  . Ascorbic Acid (VITAMIN C) 500 MG CAPS Take by mouth daily.    . calcium carbonate (TUMS - DOSED IN MG ELEMENTAL CALCIUM) 500 MG chewable tablet Chew 1 tablet by mouth as needed for indigestion or heartburn.    . cetirizine (ZYRTEC) 10 MG tablet Take 10 mg by mouth daily.    . Cholecalciferol (VITAMIN D3) 5000 units CAPS Take 1 capsule by mouth daily.    . ferrous sulfate 325 (65 FE) MG EC tablet Take 325 mg by mouth daily.    Marland Kitchen lubiprostone (AMITIZA) 8 MCG capsule Take 1 capsule (8 mcg total) by mouth 2 (two) times daily with a meal. Take twice daily for  IBS - C 60 capsule 8  . Menaquinone-7 (VITAMIN K2) 100 MCG CAPS Take 1 tablet by mouth daily.    . vitamin B-12 (CYANOCOBALAMIN) 1000 MCG tablet Take 1,000 mcg by mouth daily.     No current facility-administered medications on file prior to visit.    Review of Systems:  As per HPI- otherwise negative.   Physical Examination: Vitals:   01/27/20 0840 01/27/20 0853  BP: 124/66   Pulse: (!) 104 85  Resp: 16   Temp: 99.1 F (37.3 C)   SpO2: 98%    Vitals:   01/27/20 0840  Weight: 142 lb (64.4 kg)  Height: 5\' 7"  (1.702 m)   Body mass index is 22.24 kg/m. Ideal Body Weight: Weight in (lb) to have BMI = 25: 159.3  GEN: no acute distress.  Slender build, looks well HEENT: Atraumatic, Normocephalic.  PEERL Ears and Nose: No external deformity. CV: RRR, No M/G/R. No JVD. No thrill. No extra heart sounds. PULM: CTA B, no wheezes, crackles, rhonchi. No retractions. No resp. distress.  No accessory muscle use. ABD: S, NT, ND, +BS. No rebound. No HSM. EXTR: No c/c/e PSYCH: Normally interactive. Conversant.    Assessment and Plan: Physical exam  B12 deficiency - Plan: B12  Screening for diabetes mellitus - Plan: Hemoglobin A1c, Comprehensive metabolic panel  Screening for hyperlipidemia - Plan: Lipid panel  Screening for thyroid disorder - Plan: TSH  Encounter for hepatitis C screening test  for low risk patient - Plan: Hepatitis C antibody  Screening for HIV without presence of risk factors - Plan: HIV Antibody (routine testing w rflx)  Screening, deficiency anemia, iron - Plan: CBC  Vitamin D deficiency - Plan: VITAMIN D 25 Hydroxy (Vit-D Deficiency, Fractures)  GAD (generalized anxiety disorder) - Plan: FLUoxetine (PROZAC) 20 MG capsule  History of iron deficiency - Plan: Ferritin  Non-seasonal allergic rhinitis due to other allergic trigger - Plan: montelukast (SINGULAIR) 10 MG tablet  Generally healthy young woman here today for physical exam Routine labs pending as above, she is seen by endocrinology for history of elevated T level.  She also sees GI for history of GERD and possible IBS Continue fluoxetine 20 and Singulair 10 Will plan further follow- up pending labs. Encouraged her to continue working on exercise, continue healthy diet.  She is a non-smoker This visit occurred during the SARS-CoV-2 public health emergency.  Safety protocols were in place, including screening questions prior to the visit, additional usage of staff PPE, and extensive cleaning of exam room while observing appropriate contact time as indicated for disinfecting solutions.     Signed Lamar Blinks, MD  Addendum 8/10, received her labs as below Results for orders placed or performed in visit on 01/27/20  Hemoglobin A1c  Result Value Ref Range   Hgb A1c MFr Bld 5.2 4.6 - 6.5 %  TSH  Result Value Ref Range   TSH 1.30 0.35 - 4.50 uIU/mL  B12  Result Value Ref Range    Vitamin B-12 288 211 - 911 pg/mL  CBC  Result Value Ref Range   WBC 7.0 4.0 - 10.5 K/uL   RBC 4.73 3.87 - 5.11 Mil/uL   Platelets 140.0 (L) 150 - 400 K/uL   Hemoglobin 14.5 12.0 - 15.0 g/dL   HCT 42.1 36 - 46 %   MCV 88.9 78.0 - 100.0 fl   MCHC 34.5 30.0 - 36.0 g/dL   RDW 12.2 11.5 - 15.5 %  Comprehensive metabolic panel  Result Value Ref Range   Sodium 138 135 - 145 mEq/L   Potassium 3.7 3.5 - 5.1 mEq/L   Chloride 104 96 - 112 mEq/L   CO2 26 19 - 32 mEq/L   Glucose, Bld 79 70 - 99 mg/dL   BUN 11 6 - 23 mg/dL   Creatinine, Ser 0.88 0.40 - 1.20 mg/dL   Total Bilirubin 0.5 0.2 - 1.2 mg/dL   Alkaline Phosphatase 31 (L) 39 - 117 U/L   AST 17 0 - 37 U/L   ALT 18 0 - 35 U/L   Total Protein 7.1 6.0 - 8.3 g/dL   Albumin 4.4 3.5 - 5.2 g/dL   GFR 74.50 >60.00 mL/min   Calcium 9.1 8.4 - 10.5 mg/dL  VITAMIN D 25 Hydroxy (Vit-D Deficiency, Fractures)  Result Value Ref Range   VITD 51.22 30.00 - 100.00 ng/mL  Lipid panel  Result Value Ref Range   Cholesterol 184 0 - 200 mg/dL   Triglycerides 52.0 0 - 149 mg/dL   HDL 72.10 >39.00 mg/dL   VLDL 10.4 0.0 - 40.0 mg/dL   LDL Cholesterol 102 (H) 0 - 99 mg/dL   Total CHOL/HDL Ratio 3    NonHDL 111.93   Ferritin  Result Value Ref Range   Ferritin 28.2 10.0 - 291.0 ng/mL   Message to patient  Your A1c is normal, no sign of diabetes Thyroid in normal range B12 level normal Blood counts are basically okay; your platelet count was  a little bit low, but some of the platelets had clumped together which can make the count appear artificially low.  I would recommend rechecking a CBC in about 3 months to make sure this resolves Metabolic profile looks fine Vitamin D level normal Lipids look great Iron in normal range  I will order a CBC for you, you can schedule this as a lab visit only in about 3 months  Take care!  Otherwise we can plan to visit in 1 year, sooner if you have any concerns

## 2020-01-20 NOTE — Patient Instructions (Addendum)
Great to see you again today!    Take care and I will be in touch with your labs asap Continue prozac 20 mg a day    Health Maintenance, Female Adopting a healthy lifestyle and getting preventive care are important in promoting health and wellness. Ask your health care provider about:  The right schedule for you to have regular tests and exams.  Things you can do on your own to prevent diseases and keep yourself healthy. What should I know about diet, weight, and exercise? Eat a healthy diet   Eat a diet that includes plenty of vegetables, fruits, low-fat dairy products, and lean protein.  Do not eat a lot of foods that are high in solid fats, added sugars, or sodium. Maintain a healthy weight Body mass index (BMI) is used to identify weight problems. It estimates body fat based on height and weight. Your health care provider can help determine your BMI and help you achieve or maintain a healthy weight. Get regular exercise Get regular exercise. This is one of the most important things you can do for your health. Most adults should:  Exercise for at least 150 minutes each week. The exercise should increase your heart rate and make you sweat (moderate-intensity exercise).  Do strengthening exercises at least twice a week. This is in addition to the moderate-intensity exercise.  Spend less time sitting. Even light physical activity can be beneficial. Watch cholesterol and blood lipids Have your blood tested for lipids and cholesterol at 32 years of age, then have this test every 5 years. Have your cholesterol levels checked more often if:  Your lipid or cholesterol levels are high.  You are older than 32 years of age.  You are at high risk for heart disease. What should I know about cancer screening? Depending on your health history and family history, you may need to have cancer screening at various ages. This may include screening for:  Breast cancer.  Cervical  cancer.  Colorectal cancer.  Skin cancer.  Lung cancer. What should I know about heart disease, diabetes, and high blood pressure? Blood pressure and heart disease  High blood pressure causes heart disease and increases the risk of stroke. This is more likely to develop in people who have high blood pressure readings, are of African descent, or are overweight.  Have your blood pressure checked: ? Every 3-5 years if you are 10-77 years of age. ? Every year if you are 59 years old or older. Diabetes Have regular diabetes screenings. This checks your fasting blood sugar level. Have the screening done:  Once every three years after age 81 if you are at a normal weight and have a low risk for diabetes.  More often and at a younger age if you are overweight or have a high risk for diabetes. What should I know about preventing infection? Hepatitis B If you have a higher risk for hepatitis B, you should be screened for this virus. Talk with your health care provider to find out if you are at risk for hepatitis B infection. Hepatitis C Testing is recommended for:  Everyone born from [birth year] through 1963.  Anyone with known risk factors for hepatitis C. Sexually transmitted infections (STIs)  Get screened for STIs, including gonorrhea and chlamydia, if: ? You are sexually active and are younger than 32 years of age. ? You are older than 32 years of age and your health care provider tells you that you are at risk for this  type of infection. ? Your sexual activity has changed since you were last screened, and you are at increased risk for chlamydia or gonorrhea. Ask your health care provider if you are at risk.  Ask your health care provider about whether you are at high risk for HIV. Your health care provider may recommend a prescription medicine to help prevent HIV infection. If you choose to take medicine to prevent HIV, you should first get tested for HIV. You should then be tested every 3  months for as long as you are taking the medicine. Pregnancy  If you are about to stop having your period (premenopausal) and you may become pregnant, seek counseling before you get pregnant.  Take 400 to 800 micrograms (mcg) of folic acid every day if you become pregnant.  Ask for birth control (contraception) if you want to prevent pregnancy. Osteoporosis and menopause Osteoporosis is a disease in which the bones lose minerals and strength with aging. This can result in bone fractures. If you are 70 years old or older, or if you are at risk for osteoporosis and fractures, ask your health care provider if you should:  Be screened for bone loss.  Take a calcium or vitamin D supplement to lower your risk of fractures.  Be given hormone replacement therapy (HRT) to treat symptoms of menopause. Follow these instructions at home: Lifestyle  Do not use any products that contain nicotine or tobacco, such as cigarettes, e-cigarettes, and chewing tobacco. If you need help quitting, ask your health care provider.  Do not use street drugs.  Do not share needles.  Ask your health care provider for help if you need support or information about quitting drugs. Alcohol use  Do not drink alcohol if: ? Your health care provider tells you not to drink. ? You are pregnant, may be pregnant, or are planning to become pregnant.  If you drink alcohol: ? Limit how much you use to 0-1 drink a day. ? Limit intake if you are breastfeeding.  Be aware of how much alcohol is in your drink. In the [place] one drink equals one 12 oz bottle of beer (355 mL), one 5 oz glass of wine (148 mL), or one 1 oz glass of hard liquor (44 mL). General instructions  Schedule regular health, dental, and eye exams.  Stay current with your vaccines.  Tell your health care provider if: ? You often feel depressed. ? You have ever been abused or do not feel safe at home. Summary  Adopting a healthy lifestyle and getting  preventive care are important in promoting health and wellness.  Follow your health care provider's instructions about healthy diet, exercising, and getting tested or screened for diseases.  Follow your health care provider's instructions on monitoring your cholesterol and blood pressure. This information is not intended to replace advice given to you by your health care provider. Make sure you discuss any questions you have with your health care provider. Document Revised: 05/30/2018 Document Reviewed: 05/30/2018 Elsevier Patient Education  2020 [employer].

## 2020-01-22 ENCOUNTER — Encounter: Payer: Managed Care, Other (non HMO) | Admitting: Family Medicine

## 2020-01-27 ENCOUNTER — Ambulatory Visit (INDEPENDENT_AMBULATORY_CARE_PROVIDER_SITE_OTHER): Payer: Managed Care, Other (non HMO) | Admitting: Family Medicine

## 2020-01-27 ENCOUNTER — Encounter: Payer: Self-pay | Admitting: Physician Assistant

## 2020-01-27 ENCOUNTER — Other Ambulatory Visit: Payer: Self-pay

## 2020-01-27 ENCOUNTER — Ambulatory Visit (INDEPENDENT_AMBULATORY_CARE_PROVIDER_SITE_OTHER): Payer: Managed Care, Other (non HMO) | Admitting: Physician Assistant

## 2020-01-27 ENCOUNTER — Encounter: Payer: Self-pay | Admitting: Family Medicine

## 2020-01-27 VITALS — BP 124/66 | HR 85 | Temp 99.1°F | Resp 16 | Ht 67.0 in | Wt 142.0 lb

## 2020-01-27 VITALS — BP 110/72 | HR 70 | Ht 67.0 in | Wt 142.4 lb

## 2020-01-27 DIAGNOSIS — Z130 Encounter for screening for diseases of the blood and blood-forming organs and certain disorders involving the immune mechanism: Secondary | ICD-10-CM | POA: Diagnosis not present

## 2020-01-27 DIAGNOSIS — Z114 Encounter for screening for human immunodeficiency virus [HIV]: Secondary | ICD-10-CM

## 2020-01-27 DIAGNOSIS — Z131 Encounter for screening for diabetes mellitus: Secondary | ICD-10-CM

## 2020-01-27 DIAGNOSIS — Z13220 Encounter for screening for lipoid disorders: Secondary | ICD-10-CM

## 2020-01-27 DIAGNOSIS — Z0000 Encounter for general adult medical examination without abnormal findings: Secondary | ICD-10-CM | POA: Diagnosis not present

## 2020-01-27 DIAGNOSIS — Z8639 Personal history of other endocrine, nutritional and metabolic disease: Secondary | ICD-10-CM | POA: Diagnosis not present

## 2020-01-27 DIAGNOSIS — E538 Deficiency of other specified B group vitamins: Secondary | ICD-10-CM

## 2020-01-27 DIAGNOSIS — R140 Abdominal distension (gaseous): Secondary | ICD-10-CM | POA: Diagnosis not present

## 2020-01-27 DIAGNOSIS — E559 Vitamin D deficiency, unspecified: Secondary | ICD-10-CM | POA: Diagnosis not present

## 2020-01-27 DIAGNOSIS — Z1329 Encounter for screening for other suspected endocrine disorder: Secondary | ICD-10-CM | POA: Diagnosis not present

## 2020-01-27 DIAGNOSIS — Z1159 Encounter for screening for other viral diseases: Secondary | ICD-10-CM

## 2020-01-27 DIAGNOSIS — K581 Irritable bowel syndrome with constipation: Secondary | ICD-10-CM | POA: Diagnosis not present

## 2020-01-27 DIAGNOSIS — D696 Thrombocytopenia, unspecified: Secondary | ICD-10-CM

## 2020-01-27 DIAGNOSIS — F411 Generalized anxiety disorder: Secondary | ICD-10-CM

## 2020-01-27 DIAGNOSIS — J3089 Other allergic rhinitis: Secondary | ICD-10-CM

## 2020-01-27 LAB — LIPID PANEL
Cholesterol: 184 mg/dL (ref 0–200)
HDL: 72.1 mg/dL (ref >39.00)
LDL Cholesterol: 102 mg/dL — ABNORMAL HIGH (ref 0–99)
NonHDL: 111.93
Total CHOL/HDL Ratio: 3
Triglycerides: 52 mg/dL (ref 0.0–149.0)
VLDL: 10.4 mg/dL (ref 0.0–40.0)

## 2020-01-27 LAB — HEMOGLOBIN A1C: Hgb A1c MFr Bld: 5.2 % (ref 4.6–6.5)

## 2020-01-27 LAB — COMPREHENSIVE METABOLIC PANEL
ALT: 18 U/L (ref 0–35)
AST: 17 U/L (ref 0–37)
Albumin: 4.4 g/dL (ref 3.5–5.2)
Alkaline Phosphatase: 31 U/L — ABNORMAL LOW (ref 39–117)
BUN: 11 mg/dL (ref 6–23)
CO2: 26 mEq/L (ref 19–32)
Calcium: 9.1 mg/dL (ref 8.4–10.5)
Chloride: 104 mEq/L (ref 96–112)
Creatinine, Ser: 0.88 mg/dL (ref 0.40–1.20)
GFR: 74.5 mL/min (ref >60.00)
Glucose, Bld: 79 mg/dL (ref 70–99)
Potassium: 3.7 mEq/L (ref 3.5–5.1)
Sodium: 138 mEq/L (ref 135–145)
Total Bilirubin: 0.5 mg/dL (ref 0.2–1.2)
Total Protein: 7.1 g/dL (ref 6.0–8.3)

## 2020-01-27 LAB — CBC
HCT: 42.1 % (ref 36.0–46.0)
Hemoglobin: 14.5 g/dL (ref 12.0–15.0)
MCHC: 34.5 g/dL (ref 30.0–36.0)
MCV: 88.9 fl (ref 78.0–100.0)
Platelets: 140 10*3/uL — ABNORMAL LOW (ref 150.0–400.0)
RBC: 4.73 Mil/uL (ref 3.87–5.11)
RDW: 12.2 % (ref 11.5–15.5)
WBC: 7 10*3/uL (ref 4.0–10.5)

## 2020-01-27 LAB — VITAMIN B12: Vitamin B-12: 288 pg/mL (ref 211–911)

## 2020-01-27 LAB — VITAMIN D 25 HYDROXY (VIT D DEFICIENCY, FRACTURES): VITD: 51.22 ng/mL (ref 30.00–100.00)

## 2020-01-27 LAB — TSH: TSH: 1.3 u[IU]/mL (ref 0.35–4.50)

## 2020-01-27 LAB — FERRITIN: Ferritin: 28.2 ng/mL (ref 10.0–291.0)

## 2020-01-27 MED ORDER — FLUOXETINE HCL 20 MG PO CAPS: 20.0000 mg | ORAL_CAPSULE | Freq: Every day | ORAL | 3 refills | Status: DC

## 2020-01-27 MED ORDER — LUBIPROSTONE 24 MCG PO CAPS: 24.0000 ug | ORAL_CAPSULE | Freq: Two times a day (BID) | ORAL | 6 refills | Status: DC

## 2020-01-27 MED ORDER — MONTELUKAST SODIUM 10 MG PO TABS: 10.0000 mg | ORAL_TABLET | Freq: Every day | ORAL | 3 refills | Status: DC

## 2020-01-27 MED FILL — MONTELUKAST SOD 10 MG TAB: 10 | 30 days supply | Qty: 30 | Fill #0

## 2020-01-27 MED FILL — FLUoxetine HCL 20 MG CAPS: 20 | 30 days supply | Qty: 30 | Fill #0

## 2020-01-27 MED FILL — LUBIPROSTONE 24 MCG CAPS: 24 | 30 days supply | Qty: 60 | Fill #0

## 2020-01-27 NOTE — Progress Notes (Signed)
Subjective:    Patient ID: Kimberly Blankenship, female    DOB: Aug 29, 1985, 34 y.o.   MRN: [id number]  HPI Kimberly Blankenship is a pleasant 34 year old white female, established with Dr. Hilarie Fredrickson and known to myself.  She was last evaluated in February 2021.  She has history of IBS/C, and underwent breath testing in spring 2021 which was suggestive but not diagnostic for SIBO, breath testing for lactose intolerance negative.  She was treated with a course of Xifaxan, but relates today she really did not notice any significant change in her symptoms with the course of Xifaxan. Patient also has history of endometriosis and had undergone laparoscopic lysis of adhesions and fulguration in November 2020.  Also with history of migraines, GERD and generalized anxiety disorder. She has been on Amitiza 8 mcg twice daily since earlier this year.  She says she does not feel like it is working very well as she continues to have small pellet-like stools and is not passing much stool at a time though she does have a bowel movement daily.  She continues to complain of abdominal bloating which is usually worse later in the day, around dinnertime. She rarely drinks any carbonated beverages but definitely says bloating is worse if she does, not using any artificial sweeteners.  She admits she is not been doing very well with diet and water intake.  She has not had any follow-up with GYN since last year.  Review of Systems Pertinent positive and negative review of systems were noted in the above HPI section.  All other review of systems was otherwise negative.  Outpatient Encounter Medications as of 01/27/2020  Medication Sig  . ACETAMINOPHEN-BUTALBITAL 50-325 MG TABS May take 1-2 tablets every 4 hours as needed for headache. Max 6 per 24 hours  . ALPRAZolam (XANAX) 0.25 MG tablet Take 1 tablet (0.25 mg total) by mouth 2 (two) times daily as needed for anxiety.  . Ascorbic Acid (VITAMIN C) 500 MG CAPS Take by mouth daily.  . calcium  carbonate (TUMS - DOSED IN MG ELEMENTAL CALCIUM) 500 MG chewable tablet Chew 1 tablet by mouth as needed for indigestion or heartburn.  . cetirizine (ZYRTEC) 10 MG tablet Take 10 mg by mouth daily.  . Cholecalciferol (VITAMIN D3) 5000 units CAPS Take 1 capsule by mouth daily.  . ferrous sulfate 325 (65 FE) MG EC tablet Take 325 mg by mouth daily.  Marland Kitchen FLUoxetine (PROZAC) 20 MG capsule Take 1 capsule (20 mg total) by mouth daily.  Marland Kitchen lubiprostone (AMITIZA) 24 MCG capsule Take 1 capsule (24 mcg total) by mouth 2 (two) times daily with a meal. Take twice daily for  IBS - C  . Menaquinone-7 (VITAMIN K2) 100 MCG CAPS Take 1 tablet by mouth daily.  . montelukast (SINGULAIR) 10 MG tablet Take 1 tablet (10 mg total) by mouth at bedtime.  . vitamin B-12 (CYANOCOBALAMIN) 1000 MCG tablet Take 1,000 mcg by mouth daily.  . [DISCONTINUED] lubiprostone (AMITIZA) 8 MCG capsule Take 1 capsule (8 mcg total) by mouth 2 (two) times daily with a meal. Take twice daily for  IBS - C   No facility-administered encounter medications on file as of 01/27/2020.   Allergies  Allergen Reactions  . Sulfa Antibiotics Itching and Rash   Patient Active Problem List   Diagnosis Date Noted  . Endometriosis determined by laparoscopy 04/25/2019  . Dermoid cyst of left ovary 01/08/2019  . Hair loss disorder 01/08/2019  . Elevated testosterone level in female 01/08/2019  . Migraine  12/26/2018  . Skin cancer screening 05/28/2018  . Anemia 11/03/2017  . Metrorrhagia 11/01/2017  . GERD (gastroesophageal reflux disease) 04/29/2017  . Abnormality of immune system 03/24/2017  . Low testosterone level in female 03/24/2017  . Panic attacks 02/01/2017  . B12 deficiency 01/30/2017  . Low blood sugar 01/18/2017  . Great toe pain, left 11/03/2016  . Fatigue 06/20/2016  . Allergy to cockroaches 12/30/2015  . GAD (generalized anxiety disorder) 02/18/2014  . Chronic neck pain 01/23/2014  . Vitamin D deficiency 01/23/2014  . Oral  contraceptive use 12/25/2012  . Allergic rhinitis 06/22/2012  . Chronic tension headaches 06/22/2012   Social History   Socioeconomic History  . Marital status: Married    Spouse name: Shanon Brow  . Number of children: 0  . Years of education: Not on file  . Highest education level: Associate degree: academic program  Occupational History  . Occupation: admin assist  Tobacco Use  . Smoking status: Never Smoker  . Smokeless tobacco: Never Used  Vaping Use  . Vaping Use: Never used  Substance and Sexual Activity  . Alcohol use: Yes    Comment: occasionally  . Drug use: Never  . Sexual activity: Yes    Birth control/protection: None  Other Topics Concern  . Not on file  Social History Narrative   Patient is right-handed. She livew with her husband in a 2 story home. She drinks 1-2 cups of coffee and 1 cup of tea a day. She does not exercise.    Social Determinants of Health   Financial Resource Strain:   . Difficulty of Paying Living Expenses:   Food Insecurity:   . Worried About Charity fundraiser in the Last Year:   . Arboriculturist in the Last Year:   Transportation Needs:   . Film/video editor (Medical):   Marland Kitchen Lack of Transportation (Non-Medical):   Physical Activity:   . Days of Exercise per Week:   . Minutes of Exercise per Session:   Stress:   . Feeling of Stress :   Social Connections:   . Frequency of Communication with Friends and Family:   . Frequency of Social Gatherings with Friends and Family:   . Attends Religious Services:   . Active Member of Clubs or Organizations:   . Attends Archivist Meetings:   Marland Kitchen Marital Status:   Intimate Partner Violence:   . Fear of Current or Ex-Partner:   . Emotionally Abused:   Marland Kitchen Physically Abused:   . Sexually Abused:     [name] family history includes Arthritis in her maternal grandmother, mother, and paternal grandmother; COPD in her paternal grandmother; Depression in her brother; Diabetes in her  mother; Fibroids in her maternal grandmother and mother; Hypertension in her maternal grandfather; Miscarriages / [place] in her mother; Other in her paternal grandfather; Stomach cancer in her maternal grandfather; Uterine cancer in her paternal aunt.      Objective:    Vitals:   01/27/20 1454  BP: 110/72  Pulse: 70    Physical Exam Well-developed well-nourished young white female in no acute distress.  Height, Weight, 142 BMI 22.3  HEENT; nontraumatic normocephalic, EOMI, PER R LA, sclera anicteric. Oropharynx; not examined Neck; supple, no JVD Cardiovascular; regular rate and rhythm with S1-S2, no murmur rub or gallop Pulmonary; Clear bilaterally Abdomen; soft, mild bilateral lower quadrant tenderness, no guarding ,nondistended, no palpable mass or hepatosplenomegaly, bowel sounds are active Rectal; not done today Skin; benign exam, no  jaundice rash or appreciable lesions Extremities; no clubbing cyanosis or edema skin warm and dry Neuro/Psych; alert and oriented x4, grossly nonfocal mood and affect appropriate       Assessment & Plan:   #68 34 year old white female with history of IBS-C, and possible SIBO with indeterminant breath test and no response to Xifaxan who presents with continued complaints of constipation despite Amitiza 8 mcg twice daily, and persistent problems with abdominal bloating which is not severe. Patient does have history of endometriosis, has had laparoscopic lysis of adhesions and fulguration as of November 2020.  I suspect some of her symptoms are secondary to adhesions.  Plan; will increase Amitiza to 24 mcg twice daily.  Patient can titrate back to 24 mcg once daily if needed. Increase dietary fiber, increase water intake to 60 ounces daily. She was given a copy of a low FODMAP diet today.  I suggested she eliminate 1 category at a time and assess response over 7 to 10 days.  Avoid any trigger categories for bloating etc. Could consider repeating  course of antibiotics for SIBO though hesitant as she did not have good response to Xifaxan. We will plan follow-up in 4 to 6 weeks pending response to above.  Kimberly Danser S Lorianna Spadaccini PA-C 01/27/2020   Cc: Copland, Gay Filler, MD

## 2020-01-27 NOTE — Patient Instructions (Addendum)
If you are age 32 or older, your body mass index should be between 23-30. Your Body mass index is 22.3 kg/m. If this is out of the aforementioned range listed, please consider follow up with your Primary Care Provider.  If you are age 42 or younger, your body mass index should be between 19-25. Your Body mass index is 22.3 kg/m. If this is out of the aformentioned range listed, please consider follow up with your Primary Care Provider.   INCREASE Amitiza to 24 mcg twice a day.  Increase your water intake to 60 ounces daily  Make a follow up appointment with your Gyn.  Follow Low Fodmap trial-one category at a time for 10 days.  Follow up as needed.

## 2020-01-28 LAB — HIV ANTIBODY (ROUTINE TESTING W REFLEX): HIV 1&2 Ab, 4th Generation: NONREACTIVE

## 2020-01-28 LAB — HEPATITIS C ANTIBODY
Hepatitis C Ab: NONREACTIVE
SIGNAL TO CUT-OFF: 0.02 (ref <1.00)

## 2020-01-28 NOTE — Addendum Note (Signed)
Addended by: Lamar Blinks C on: 01/28/2020 07:58 AM   Modules accepted: Orders

## 2020-02-10 NOTE — Progress Notes (Signed)
Addendum: Reviewed and agree with assessment and management plan. Kayia Billinger M, MD  

## 2020-03-09 MED FILL — FLUoxetine HCL 20 MG CAPS: 20 | 30 days supply | Qty: 30 | Fill #1

## 2020-04-06 ENCOUNTER — Ambulatory Visit: Payer: Managed Care, Other (non HMO) | Admitting: Podiatry

## 2020-04-06 ENCOUNTER — Other Ambulatory Visit: Payer: Self-pay

## 2020-04-06 DIAGNOSIS — B351 Tinea unguium: Secondary | ICD-10-CM | POA: Diagnosis not present

## 2020-04-06 DIAGNOSIS — L600 Ingrowing nail: Secondary | ICD-10-CM | POA: Diagnosis not present

## 2020-04-06 DIAGNOSIS — M79674 Pain in right toe(s): Secondary | ICD-10-CM

## 2020-04-06 DIAGNOSIS — M79675 Pain in left toe(s): Secondary | ICD-10-CM

## 2020-04-06 NOTE — Patient Instructions (Signed)

## 2020-04-07 NOTE — Progress Notes (Signed)
Subjective: 34 year old female presents the office today for concerns of ingrowing of her toenails on both big toes.  She states the nails become soft in the corners and will break off causing ingrowing.  She states it started become more difficult to manage and she wants to see if there is any treatment for the nail back to be performed to help with this versus the procedure to remove the ingrown toenails.  She denies any redness or drainage or any swelling.  The nails become tender and they get ingrown. Denies any systemic complaints such as fevers, chills, nausea, vomiting. No acute changes since last appointment, and no other complaints at this time.   Objective: AAO x3, NAD DP/PT pulses palpable bilaterally, CRT less than 3 seconds Mild incurvation of the hallux toenails distally about the medial lateral borders bilaterally.  On the borders the nails are brittle and slight yellow to brown discoloration and mild dystrophy is noted.  There is no pain today.  There is no edema, erythema, drainage or pus.  No pain with calf compression, swelling, warmth, erythema  Assessment: Ingrown toenail, possible onychomycosis  Plan: -All treatment options discussed with the patient including all alternatives, risks, complications.  -We discussed various treatment options.  Discussed partial nail avulsions with chemical matricectomy versus trying to treat and strengthen the toenails.  Start with a culture of the nail to see if there is any fungus that can be treated.  Nails were debrided with any complications or bleeding and sent this to [facility] labs.  -Patient encouraged to call the office with any questions, concerns, change in symptoms.   Trula Slade DPM

## 2020-04-22 ENCOUNTER — Encounter: Payer: Self-pay | Admitting: Podiatry

## 2020-04-23 NOTE — Addendum Note (Signed)
Addended by: Kelle Darting A on: 04/23/2020 01:49 PM   Modules accepted: Orders

## 2020-04-24 ENCOUNTER — Other Ambulatory Visit (INDEPENDENT_AMBULATORY_CARE_PROVIDER_SITE_OTHER): Payer: Managed Care, Other (non HMO)

## 2020-04-24 ENCOUNTER — Other Ambulatory Visit: Payer: Self-pay

## 2020-04-24 DIAGNOSIS — D696 Thrombocytopenia, unspecified: Secondary | ICD-10-CM | POA: Diagnosis not present

## 2020-04-24 DIAGNOSIS — Z0000 Encounter for general adult medical examination without abnormal findings: Secondary | ICD-10-CM

## 2020-04-28 ENCOUNTER — Ambulatory Visit: Payer: Managed Care, Other (non HMO)

## 2020-04-28 ENCOUNTER — Encounter: Payer: Self-pay | Admitting: Family Medicine

## 2020-04-28 ENCOUNTER — Other Ambulatory Visit: Payer: Self-pay

## 2020-04-28 ENCOUNTER — Other Ambulatory Visit (INDEPENDENT_AMBULATORY_CARE_PROVIDER_SITE_OTHER): Payer: Managed Care, Other (non HMO)

## 2020-04-28 DIAGNOSIS — Z0000 Encounter for general adult medical examination without abnormal findings: Secondary | ICD-10-CM | POA: Diagnosis not present

## 2020-04-28 LAB — CBC
HCT: 41.4 % (ref 36.0–46.0)
Hemoglobin: 14.1 g/dL (ref 12.0–15.0)
MCHC: 34 g/dL (ref 30.0–36.0)
MCV: 89.9 fl (ref 78.0–100.0)
Platelets: 208 10*3/uL (ref 150.0–400.0)
RBC: 4.61 Mil/uL (ref 3.87–5.11)
RDW: 12.3 % (ref 11.5–15.5)
WBC: 5.4 10*3/uL (ref 4.0–10.5)

## 2020-06-02 ENCOUNTER — Other Ambulatory Visit (HOSPITAL_BASED_OUTPATIENT_CLINIC_OR_DEPARTMENT_OTHER): Payer: Self-pay | Admitting: Obstetrics and Gynecology

## 2020-06-02 MED FILL — IBUPROFEN 400 MG TABS: 400 | 8 days supply | Qty: 60 | Fill #0

## 2020-06-04 ENCOUNTER — Encounter: Payer: Self-pay | Admitting: Family Medicine

## 2020-06-04 DIAGNOSIS — F411 Generalized anxiety disorder: Secondary | ICD-10-CM

## 2020-06-04 MED ORDER — FLUOXETINE HCL 20 MG PO CAPS: 20.0000 mg | ORAL_CAPSULE | Freq: Every day | ORAL | 3 refills | Status: DC

## 2020-06-05 ENCOUNTER — Other Ambulatory Visit: Payer: Self-pay | Admitting: Family Medicine

## 2020-06-05 MED ORDER — BUTALBITAL-ACETAMINOPHEN 50-325 MG PO TABS: ORAL_TABLET | ORAL | 0 refills | Status: DC

## 2020-06-05 MED FILL — BUTALBITAL/ACETAMINOPHEN 50: 50-325 | 5 days supply | Qty: 30 | Fill #0

## 2020-06-05 NOTE — Addendum Note (Signed)
Addended by: Lamar Blinks C on: 06/05/2020 06:36 AM   Modules accepted: Orders

## 2020-06-10 ENCOUNTER — Ambulatory Visit: Payer: Managed Care, Other (non HMO) | Admitting: Family Medicine

## 2020-06-10 ENCOUNTER — Other Ambulatory Visit: Payer: Self-pay | Admitting: Family Medicine

## 2020-06-10 ENCOUNTER — Telehealth: Payer: Self-pay

## 2020-06-10 ENCOUNTER — Other Ambulatory Visit: Payer: Self-pay

## 2020-06-10 VITALS — BP 122/64 | HR 108 | Resp 17 | Ht 67.0 in | Wt 142.0 lb

## 2020-06-10 DIAGNOSIS — F411 Generalized anxiety disorder: Secondary | ICD-10-CM

## 2020-06-10 DIAGNOSIS — M545 Low back pain, unspecified: Secondary | ICD-10-CM | POA: Diagnosis not present

## 2020-06-10 LAB — POCT URINE PREGNANCY: Preg Test, Ur: NEGATIVE

## 2020-06-10 MED ORDER — FLUOXETINE HCL 10 MG PO CAPS: 10.0000 mg | ORAL_CAPSULE | Freq: Every day | ORAL | 3 refills | Status: DC

## 2020-06-10 MED ORDER — CYCLOBENZAPRINE HCL 10 MG PO TABS: 5.0000 mg | ORAL_TABLET | Freq: Two times a day (BID) | ORAL | 0 refills | Status: DC | PRN

## 2020-06-10 MED ORDER — HYDROCODONE-ACETAMINOPHEN 5-325 MG PO TABS: 1.0000 | ORAL_TABLET | Freq: Three times a day (TID) | ORAL | 0 refills | Status: DC | PRN

## 2020-06-10 MED FILL — CYCLOBENZAPRINE HCL 10 MG T: 10 | 15 days supply | Qty: 30 | Fill #0

## 2020-06-10 MED FILL — HYDROCODON-APAP 5-325: 5-325 | 5 days supply | Qty: 15 | Fill #0

## 2020-06-10 NOTE — Progress Notes (Signed)
Kimberly Blankenship at [facility] [place], [place], [place] [address] [phone]  Date:  06/10/2020   Name:  Kimberly Blankenship   DOB:  [birth year]   MRN:  [id number]  PCP:  Darreld Mclean, MD    Chief Complaint: Back Pain (Lower back and hip pain, no known injury)   History of Present Illness:  Kimberly Blankenship is a 34 y.o. very pleasant female patient who presents with the following:  Patient with history of GERD, B12 deficiency, endometriosis, menorrhagia who called in earlier today with concern of back pain She had some lower back pain that she first noted yesterday around dinner time It got worse last night and was quite severe She is a bit better today than she was last night -she has used over-the-counter medications and also cold which helps some  NKI No recent lifting No blood in her urine  She tried some tylenol and advil that helped a bit   She has pain in both sides of her lower back It does not tend to radiate into her legs or feet Yesterday she had some radiation down her left leg but this is now resolved No weakness or tingling of her legs, no numbness No bowel or bladder control concerns   No fever  She does not suspect pregnancy but is not certain, will do hCG today   Patient Active Problem List   Diagnosis Date Noted  . Endometriosis determined by laparoscopy 04/25/2019  . Dermoid cyst of left ovary 01/08/2019  . Hair loss disorder 01/08/2019  . Elevated testosterone level in female 01/08/2019  . Migraine 12/26/2018  . Skin cancer screening 05/28/2018  . Anemia 11/03/2017  . Metrorrhagia 11/01/2017  . GERD (gastroesophageal reflux disease) 04/29/2017  . Abnormality of immune system 03/24/2017  . Low testosterone level in female 03/24/2017  . Panic attacks 02/01/2017  . B12 deficiency 01/30/2017  . Low blood sugar 01/18/2017  . Great toe pain, left 11/03/2016  . Fatigue 06/20/2016  . Allergy to  cockroaches 12/30/2015  . GAD (generalized anxiety disorder) 02/18/2014  . Chronic neck pain 01/23/2014  . Vitamin D deficiency 01/23/2014  . Oral contraceptive use 12/25/2012  . Allergic rhinitis 06/22/2012  . Chronic tension headaches 06/22/2012    Past Medical History:  Diagnosis Date  . Anxiety   . Depression   . Endometriosis determined by laparoscopy 04/25/2019  . GERD (gastroesophageal reflux disease)    occasional  . Headache, migraine   . IBS (irritable bowel syndrome)   . IDA (iron deficiency anemia)   . Left ovarian cyst     Past Surgical History:  Procedure Laterality Date  . LYSIS OF ADHESION N/A 04/25/2019   Procedure: LYSIS OF ADHESION, FULGURATION OF ENDOMETRIOSIS;  Surgeon: Sherlyn Hay, DO;  Location: [place];  Service: Gynecology;  Laterality: N/A;  . NO PAST SURGERIES    . wisdom teeth      Social History   Tobacco Use  . Smoking status: Never Smoker  . Smokeless tobacco: Never Used  Vaping Use  . Vaping Use: Never used  Substance Use Topics  . Alcohol use: Yes    Comment: occasionally  . Drug use: Never    Family History  Problem Relation Age of Onset  . Arthritis Mother   . Diabetes Mother   . Miscarriages / [place] Mother   . Fibroids Mother   . Depression Brother   . Arthritis Maternal Grandmother   .  Fibroids Maternal Grandmother   . Hypertension Maternal Grandfather   . Stomach cancer Maternal Grandfather   . Arthritis Paternal Grandmother   . COPD Paternal Grandmother   . Other Paternal Grandfather        Brain tumor  . Uterine cancer Paternal Aunt   . Colon cancer Neg Hx   . Esophageal cancer Neg Hx   . Pancreatic cancer Neg Hx   . Liver disease Neg Hx     Allergies  Allergen Reactions  . Sulfa Antibiotics Itching and Rash    Medication list has been reviewed and updated.  Current Outpatient Medications on File Prior to Visit  Medication Sig Dispense Refill  . ACETAMINOPHEN-BUTALBITAL  50-325 MG TABS May take 1-2 tablets every 4 hours as needed for headache. Max 6 per 24 hours 30 tablet 0  . ALPRAZolam (XANAX) 0.25 MG tablet Take 1 tablet (0.25 mg total) by mouth 2 (two) times daily as needed for anxiety. 30 tablet 1  . Ascorbic Acid (VITAMIN C) 500 MG CAPS Take by mouth daily.    . calcium carbonate (TUMS - DOSED IN MG ELEMENTAL CALCIUM) 500 MG chewable tablet Chew 1 tablet by mouth as needed for indigestion or heartburn.    . cetirizine (ZYRTEC) 10 MG tablet Take 10 mg by mouth daily.    . Cholecalciferol (VITAMIN D3) 5000 units CAPS Take 1 capsule by mouth daily.    . ferrous sulfate 325 (65 FE) MG EC tablet Take 325 mg by mouth daily.    Marland Kitchen FLUoxetine (PROZAC) 20 MG capsule Take 1 capsule (20 mg total) by mouth daily. 90 capsule 3  . lubiprostone (AMITIZA) 24 MCG capsule Take 1 capsule (24 mcg total) by mouth 2 (two) times daily with a meal. Take twice daily for  IBS - C 60 capsule 6  . Menaquinone-7 (VITAMIN K2) 100 MCG CAPS Take 1 tablet by mouth daily.    . montelukast (SINGULAIR) 10 MG tablet Take 1 tablet (10 mg total) by mouth at bedtime. 90 tablet 3  . vitamin B-12 (CYANOCOBALAMIN) 1000 MCG tablet Take 1,000 mcg by mouth daily.     No current facility-administered medications on file prior to visit.    Review of Systems:  As per HPI- otherwise negative. No bowel or bladder incontinence  Physical Examination: Vitals:   06/10/20 1135  BP: 122/64  Pulse: (!) 108  Resp: 17  SpO2: 99%   Vitals:   06/10/20 1135  Weight: 142 lb (64.4 kg)  Height: 5\' 7"  (1.702 m)   Body mass index is 22.24 kg/m. Ideal Body Weight: Weight in (lb) to have BMI = 25: 159.3  GEN: no acute distress.  Looks well except appears to be in pain from her back.  She is walking slowly and is still somewhat forward HEENT: Atraumatic, Normocephalic.  Ears and Nose: No external deformity. CV: RRR, No M/G/R. No JVD. No thrill. No extra heart sounds. PULM: CTA B, no wheezes, crackles,  rhonchi. No retractions. No resp. distress. No accessory muscle use. ABD: S, NT, ND EXTR: No c/c/e PSYCH: Normally interactive. Conversant.  Patient has tenderness in the musculature of the bilateral lumbar area.  No redness or heat.  Not able to test flexion and extension due to pain.  Normal bilateral lower extremity strength, sensation, DTR.  Negative straight leg raise   Assessment and Plan: Acute bilateral low back pain without sciatica - Plan: POCT urine pregnancy, cyclobenzaprine (FLEXERIL) 10 MG tablet, HYDROcodone-acetaminophen (NORCO/VICODIN) 5-325 MG tablet  GAD (generalized  anxiety disorder) - Plan: FLUoxetine (PROZAC) 10 MG capsule  Patient today with concern of acute bilateral lower back pain, seems most likely muscular in origin.  Prescribed Flexeril and hydrocodone No. 15 tablets.  Advised patient that both of these medications can cause drowsiness, avoid using them together.  I would suggest using hydrocodone during the day as necessary and Flexeril at night.  When she is able, recommend gentle movement and exercise such as walking and gentle stretching, ice or heat as needed, over-the-counter NSAIDs as well I have asked her to reach out to me if not feeling better in the next few days, sooner if worse  Refill of her fluoxetine 10 mg today-we erroneously given her 20 mg previously  Signed Lamar Blinks, MD

## 2020-06-10 NOTE — Telephone Encounter (Signed)
Spoke with patient, she states she is so grateful we offered to see her however she already spoke with our office this morning and was notified we do not have any available appointments. However since then, a patient canceled so we were able to work her in-patient was already on the way to urgent care but again thanks [place].

## 2020-06-10 NOTE — Telephone Encounter (Signed)
Nurse Assessment Nurse: Radford Pax, RN, Eugene Garnet Date/Time Eilene Ghazi Time): 06/10/2020 7:12:01 AM Confirm and document reason for call. If symptomatic, describe symptoms. ---Caller states she is needing to be seen today, due to c/o lower back pain that she can hardly stand and or walk, shuffles her feet to move around x 1 night. No know injury. Pt did get a COVID booster yesterday. Does the patient have any new or worsening symptoms? ---Yes Will a triage be completed? ---Yes Related visit to physician within the last 2 weeks? ---No Does the PT have any chronic conditions? (i.e. diabetes, asthma, this includes High risk factors for pregnancy, etc.) ---No Is the patient pregnant or possibly pregnant? (Ask all females between the ages of 61-55) ---No Is this a behavioral health or substance abuse call? ---No Guidelines Guideline Title Affirmed Question Affirmed Notes Nurse Date/Time (Eastern Time) Back Pain [1] SEVERE back pain (e.g., excruciating, unable to do any normal activities) AND [2] not improved 2 hours after pain medicine Turner, RN, Eugene Garnet 06/10/2020 7:14:00 AM Disp. Time Eilene Ghazi Time) Disposition Final User PLEASE NOTE: All timestamps contained within this report are represented as [place] Standard Time. CONFIDENTIALTY NOTICE: This fax transmission is intended only for the addressee. It contains information that is legally privileged, confidential or otherwise protected from use or disclosure. If you are not the intended recipient, you are strictly prohibited from reviewing, disclosing, copying using or disseminating any of this information or taking any action in reliance on or regarding this information. If you have received this fax in error, please notify us immediately by telephone so that we can arrange for its return to [place]. Phone: [phone], Toll-Free: [phone], Fax: [phone] Page: 2 of 2 Call Id: [id number] 06/10/2020 7:16:51 AM See HCP within 4 Hours (or  PCP triage) Yes Radford Pax, RN, Sharion Settler Disagree/Comply Comply Caller Understands Yes PreDisposition Call Doctor Care Advice Given Per Guideline SEE HCP (OR PCP TRIAGE) WITHIN 4 HOURS: * IF OFFICE WILL BE OPEN: You need to be seen within the next 3 or 4 hours. Call your doctor (or NP/PA) now or as soon as the office opens. PAIN MEDICINES: * For pain relief, you can take either acetaminophen, ibuprofen, or naproxen. * They are over-the-counter (OTC) pain drugs. You can buy them at the drugstore. * ACETAMINOPHEN - REGULAR STRENGTH TYLENOL: Take 650 mg (two 325 mg pills) by mouth every 4 to 6 hours as needed. Each Regular Strength Tylenol pill has 325 mg of acetaminophen. The most you should take each day is 3,250 mg (10 pills a day). * ACETAMINOPHEN - EXTRA STRENGTH TYLENOL: Take 1,000 mg (two 500 mg pills) every 8 hours as needed. Each Extra Strength Tylenol pill has 500 mg of acetaminophen. The most you should take each day is 3,000 mg (6 pills a day). * IBUPROFEN (E.G., MOTRIN, ADVIL): Take 400 mg (two 200 mg pills) by mouth every 6 hours. The most you should take each day is 1,200 mg (six 200 mg pills), unless your doctor has told you to take more. CALL BACK IF: * You become worse CARE ADVICE given per Back Pain (Adult) guideline. Referrals REFERRED TO PCP OFFICE

## 2020-06-10 NOTE — Patient Instructions (Signed)
It was good to see you today but I am sorry your back is hurting!  I am going to give you 2 types of medication, a muscle relaxer and pain medication.  Use these as needed, the muscle relaxer may cause you to feel more drowsy- you may prefer to take this at bedtime.  You can also certainly use ibuprofen, and heat or cold as needed needed.  Please let me know if you are not feeling better over the next couple of days, sooner if you get worse

## 2020-06-18 ENCOUNTER — Other Ambulatory Visit (HOSPITAL_BASED_OUTPATIENT_CLINIC_OR_DEPARTMENT_OTHER): Payer: Self-pay | Admitting: Chiropractic Medicine

## 2020-06-18 MED FILL — predniSONE 10 MG TABS: 10 | 6 days supply | Qty: 21 | Fill #0

## 2020-07-09 ENCOUNTER — Ambulatory Visit (HOSPITAL_BASED_OUTPATIENT_CLINIC_OR_DEPARTMENT_OTHER)
Admission: RE | Admit: 2020-07-09 | Discharge: 2020-07-09 | Disposition: A | Discharge status: Home / Self Care | Payer: Managed Care, Other (non HMO) | Source: Ambulatory Visit | Attending: Family Medicine | Admitting: Family Medicine

## 2020-07-09 ENCOUNTER — Other Ambulatory Visit: Payer: Self-pay | Admitting: Family Medicine

## 2020-07-09 ENCOUNTER — Ambulatory Visit: Payer: Managed Care, Other (non HMO) | Admitting: Family Medicine

## 2020-07-09 ENCOUNTER — Encounter: Payer: Self-pay | Admitting: Family Medicine

## 2020-07-09 ENCOUNTER — Other Ambulatory Visit: Payer: Self-pay

## 2020-07-09 VITALS — BP 134/62 | HR 99 | Resp 16 | Ht 67.0 in | Wt 142.0 lb

## 2020-07-09 DIAGNOSIS — M79645 Pain in left finger(s): Secondary | ICD-10-CM

## 2020-07-09 DIAGNOSIS — Z23 Encounter for immunization: Secondary | ICD-10-CM

## 2020-07-09 MED ORDER — DICLOFENAC SODIUM 75 MG PO TBEC: 75.0000 mg | DELAYED_RELEASE_TABLET | Freq: Two times a day (BID) | ORAL | 0 refills | Status: DC

## 2020-07-09 MED FILL — DICLOFENAC SODIUM 75 MG TAB: 75 | 15 days supply | Qty: 30 | Fill #0

## 2020-07-09 NOTE — Patient Instructions (Signed)
Please go to the ground floor to have an x-ray of your hand, and we will use voltaren as needed for pain If you see signs of infection please contact me Use the splint as needed for comfort Please let me know if not better by Monday! Sooner if worse

## 2020-07-09 NOTE — Progress Notes (Signed)
[facility] [place], [place], [place] [address] [phone]  Date:  07/09/2020   Name:  Kimberly Blankenship   DOB:  1985-12-07   MRN:  [id number]  PCP:  Darreld Mclean, MD    Chief Complaint: Hand Pain (Left middle finger pain, felt like it was catching, swelling)   History of Present Illness:  Kimberly Blankenship is a 35 y.o. very pleasant female patient who presents with the following: Generally healthy young woman- here today with a concern about a painful finger Last seen by myself in December with acute lower back pain  History of GERD, B12 deficiency, endometriosis, menorrhagia  She has noted pain in the DIP joint of the left long finger for the last several days.  She cannot determine any injury, has never had this before.  She notes that it is very painful to bend or move the DIP joint.  She has no other joint issues at this time, otherwise feels well.  She does not see any evidence of infection  Patient Active Problem List   Diagnosis Date Noted  . Endometriosis determined by laparoscopy 04/25/2019  . Dermoid cyst of left ovary 01/08/2019  . Hair loss disorder 01/08/2019  . Elevated testosterone level in female 01/08/2019  . Migraine 12/26/2018  . Skin cancer screening 05/28/2018  . Anemia 11/03/2017  . Metrorrhagia 11/01/2017  . GERD (gastroesophageal reflux disease) 04/29/2017  . Abnormality of immune system 03/24/2017  . Low testosterone level in female 03/24/2017  . Panic attacks 02/01/2017  . B12 deficiency 01/30/2017  . Low blood sugar 01/18/2017  . Great toe pain, left 11/03/2016  . Fatigue 06/20/2016  . Allergy to cockroaches 12/30/2015  . GAD (generalized anxiety disorder) 02/18/2014  . Chronic neck pain 01/23/2014  . Vitamin D deficiency 01/23/2014  . Oral contraceptive use 12/25/2012  . Allergic rhinitis 06/22/2012  . Chronic tension headaches 06/22/2012    Past Medical History:   Diagnosis Date  . Anxiety   . Depression   . Endometriosis determined by laparoscopy 04/25/2019  . GERD (gastroesophageal reflux disease)    occasional  . Headache, migraine   . IBS (irritable bowel syndrome)   . IDA (iron deficiency anemia)   . Left ovarian cyst     Past Surgical History:  Procedure Laterality Date  . LYSIS OF ADHESION N/A 04/25/2019   Procedure: LYSIS OF ADHESION, FULGURATION OF ENDOMETRIOSIS;  Surgeon: Sherlyn Hay, DO;  Location: [facility];  Service: Gynecology;  Laterality: N/A;  . NO PAST SURGERIES    . wisdom teeth      Social History   Tobacco Use  . Smoking status: Never Smoker  . Smokeless tobacco: Never Used  Vaping Use  . Vaping Use: Never used  Substance Use Topics  . Alcohol use: Yes    Comment: occasionally  . Drug use: Never    Family History  Problem Relation Age of Onset  . Arthritis Mother   . Diabetes Mother   . Miscarriages / [place] Mother   . Fibroids Mother   . Depression Brother   . Arthritis Maternal Grandmother   . Fibroids Maternal Grandmother   . Hypertension Maternal Grandfather   . Stomach cancer Maternal Grandfather   . Arthritis Paternal Grandmother   . COPD Paternal Grandmother   . Other Paternal Grandfather        Brain tumor  . Uterine cancer Paternal Aunt   . Colon  cancer Neg Hx   . Esophageal cancer Neg Hx   . Pancreatic cancer Neg Hx   . Liver disease Neg Hx     Allergies  Allergen Reactions  . Sulfa Antibiotics Itching and Rash    Medication list has been reviewed and updated.  Current Outpatient Medications on File Prior to Visit  Medication Sig Dispense Refill  . ACETAMINOPHEN-BUTALBITAL 50-325 MG TABS May take 1-2 tablets every 4 hours as needed for headache. Max 6 per 24 hours 30 tablet 0  . ALPRAZolam (XANAX) 0.25 MG tablet Take 1 tablet (0.25 mg total) by mouth 2 (two) times daily as needed for anxiety. 30 tablet 1  . Ascorbic Acid (VITAMIN C) 500 MG CAPS Take  by mouth daily.    . calcium carbonate (TUMS - DOSED IN MG ELEMENTAL CALCIUM) 500 MG chewable tablet Chew 1 tablet by mouth as needed for indigestion or heartburn.    . cetirizine (ZYRTEC) 10 MG tablet Take 10 mg by mouth daily.    . Cholecalciferol (VITAMIN D3) 5000 units CAPS Take 1 capsule by mouth daily.    . cyclobenzaprine (FLEXERIL) 10 MG tablet Take 0.5-1 tablets (5-10 mg total) by mouth 2 (two) times daily as needed for muscle spasms. 30 tablet 0  . ferrous sulfate 325 (65 FE) MG EC tablet Take 325 mg by mouth daily.    Marland Kitchen FLUoxetine (PROZAC) 10 MG capsule Take 1 capsule (10 mg total) by mouth daily. 90 capsule 3  . lubiprostone (AMITIZA) 24 MCG capsule Take 1 capsule (24 mcg total) by mouth 2 (two) times daily with a meal. Take twice daily for  IBS - C 60 capsule 6  . Menaquinone-7 (VITAMIN K2) 100 MCG CAPS Take 1 tablet by mouth daily.    . montelukast (SINGULAIR) 10 MG tablet Take 1 tablet (10 mg total) by mouth at bedtime. 90 tablet 3  . vitamin B-12 (CYANOCOBALAMIN) 1000 MCG tablet Take 1,000 mcg by mouth daily.     No current facility-administered medications on file prior to visit.    Review of Systems:  As per HPI- otherwise negative.   Physical Examination: Vitals:   07/09/20 1553  BP: 134/62  Pulse: 99  Resp: 16  SpO2: 100%   Vitals:   07/09/20 1553  Weight: 142 lb (64.4 kg)  Height: 5\' 7"  (1.702 m)   Body mass index is 22.24 kg/m. Ideal Body Weight: Weight in (lb) to have BMI = 25: 159.3  GEN: no acute distress.  Normal weight, looks well HEENT: Atraumatic, Normocephalic.  Ears and Nose: No external deformity. CV: RRR, No M/G/R. No JVD. No thrill. No extra heart sounds. PULM: CTA B, no wheezes, crackles, rhonchi. No retractions. No resp. distress. No accessory muscle use. EXTR: No c/c/e PSYCH: Normally interactive. Conversant.  Bilateral hands and wrists are normal except for the left long finger DIP joint.  The patient has tenderness at this joint with  pressure or movement.  There is no redness, swelling, or evidence of cellulitis or paronychia   Assessment and Plan: Pain of finger of left hand - Plan: DG Hand Complete Left, diclofenac (VOLTAREN) 75 MG EC tablet  Needs flu shot - Plan: Flu Vaccine QUAD 6+ mos PF IM (Fluarix Quad PF)  Patient seen today with unusual pain in her left long finger.  No evidence of infection, the patient cannot recall any injury.  We will plan to obtain a plain film, will have her take Voltaren as needed for pain.  I also gave  her a fold over splint to use as needed for comfort.  The patient plans to contact me if symptoms do not resolve over the next few days.  In that case we can plan to have her see hand surgery for further evaluation  Given flu shot today  This visit occurred during the SARS-CoV-2 public health emergency.  Safety protocols were in place, including screening questions prior to the visit, additional usage of staff PPE, and extensive cleaning of exam room while observing appropriate contact time as indicated for disinfecting solutions.    Signed Lamar Blinks, MD

## 2020-07-10 ENCOUNTER — Encounter: Payer: Self-pay | Admitting: Family Medicine

## 2020-07-13 ENCOUNTER — Encounter: Payer: Self-pay | Admitting: Family Medicine

## 2020-08-10 ENCOUNTER — Encounter: Payer: Self-pay | Admitting: Family Medicine

## 2020-08-10 DIAGNOSIS — F411 Generalized anxiety disorder: Secondary | ICD-10-CM

## 2020-08-10 MED ORDER — FLUOXETINE HCL 20 MG PO CAPS: 20.0000 mg | ORAL_CAPSULE | Freq: Every day | ORAL | 3 refills | Status: DC

## 2020-10-20 NOTE — Patient Instructions (Addendum)
It was great to see you again today! I will be in touch with your labs asap Stop prozac and start on effexor/ venlafaxine 37.5 once daily Let me know how this is working for you and if it seems to help with your night sweats

## 2020-10-20 NOTE — Progress Notes (Addendum)
[facility] at [facility] [address], [address] [place], [place] [address] [phone]  Date:  10/21/2020   Name:  Kimberly Blankenship   DOB:  1985-10-01   MRN:  [id number]  PCP:  Pearline Cables, MD    Chief Complaint: Night Sweats (Several months) and Bloated (And food poisoning for the last couple of weeks )   History of Present Illness:  Kimberly Blankenship is a 35 y.o. very pleasant female patient who presents with the following:  Patient seen today with concern of night sweats and bloating Last seen by myself in January with concern of finger pain  History of GERD, B12 deficiency, endometriosis, menorrhagia  Labs done January 20, 2020  Her stomach bloating and other sx are now actually resolved. She notes that about 3 weeks ago she seemed to have food poisoning.  However, this is finally feeling better-she is not sure what she might have eaten, but had typical symptoms of vomiting and diarrhea She uses Amitaza off and on In any case, the symptoms are no longer bothering her  Also, she has noted night sweats for a few months This may occur the majority of nights but not every night She will wake up quite sweaty -does not generally get up and change her clothing or bedding  Her menses are normal No fevers or weight loss   She is taking fluoxetine 20 mg right now- her depression sx are under ok control but not great  She has read that fluoxetine could cause night sweats.  Since this medication is not working wonderfully for her she would be interested in taking something else perhaps Patient Active Problem List   Diagnosis Date Noted  . Endometriosis determined by laparoscopy 04/25/2019  . Dermoid cyst of left ovary 01/08/2019  . Hair loss disorder 01/08/2019  . Elevated testosterone level in female 01/08/2019  . Migraine 12/26/2018  . Skin cancer screening 05/28/2018  . Anemia 11/03/2017  . Metrorrhagia 11/01/2017  . GERD  (gastroesophageal reflux disease) 04/29/2017  . Abnormality of immune system 03/24/2017  . Low testosterone level in female 03/24/2017  . Panic attacks 02/01/2017  . B12 deficiency 01/30/2017  . Low blood sugar 01/18/2017  . Great toe pain, left 11/03/2016  . Fatigue 06/20/2016  . Allergy to cockroaches 12/30/2015  . GAD (generalized anxiety disorder) 02/18/2014  . Chronic neck pain 01/23/2014  . Vitamin D deficiency 01/23/2014  . Oral contraceptive use 12/25/2012  . Allergic rhinitis 06/22/2012  . Chronic tension headaches 06/22/2012    Past Medical History:  Diagnosis Date  . Anxiety   . Depression   . Endometriosis determined by laparoscopy 04/25/2019  . GERD (gastroesophageal reflux disease)    occasional  . Headache, migraine   . IBS (irritable bowel syndrome)   . IDA (iron deficiency anemia)   . Left ovarian cyst     Past Surgical History:  Procedure Laterality Date  . LYSIS OF ADHESION N/A 04/25/2019   Procedure: LYSIS OF ADHESION, FULGURATION OF ENDOMETRIOSIS;  Surgeon: Edwinna Areola, DO;  Location: [facility];  Service: Gynecology;  Laterality: N/A;  . NO PAST SURGERIES    . wisdom teeth      Social History   Tobacco Use  . Smoking status: Never Smoker  . Smokeless tobacco: Never Used  Vaping Use  . Vaping Use: Never used  Substance Use Topics  . Alcohol use: Yes    Comment: occasionally  . Drug  use: Never    Family History  Problem Relation Age of Onset  . Arthritis Mother   . Diabetes Mother   . Miscarriages / [place] Mother   . Fibroids Mother   . Depression Brother   . Arthritis Maternal Grandmother   . Fibroids Maternal Grandmother   . Hypertension Maternal Grandfather   . Stomach cancer Maternal Grandfather   . Arthritis Paternal Grandmother   . COPD Paternal Grandmother   . Other Paternal Grandfather        Brain tumor  . Uterine cancer Paternal Aunt   . Colon cancer Neg Hx   . Esophageal cancer Neg Hx   .  Pancreatic cancer Neg Hx   . Liver disease Neg Hx     Allergies  Allergen Reactions  . Sulfa Antibiotics Itching and Rash    Medication list has been reviewed and updated.  Current Outpatient Medications on File Prior to Visit  Medication Sig Dispense Refill  . ACETAMINOPHEN-BUTALBITAL 50-325 MG TABS TAKE 1-2 TABLETS EVERY 4 HOURS AS NEEDED FOR HEADACHE. MAX 6 PER 24 HOURS 30 tablet 0  . ALPRAZolam (XANAX) 0.25 MG tablet Take 1 tablet (0.25 mg total) by mouth 2 (two) times daily as needed for anxiety. 30 tablet 1  . Ascorbic Acid (VITAMIN C) 500 MG CAPS Take by mouth daily.    . calcium carbonate (TUMS - DOSED IN MG ELEMENTAL CALCIUM) 500 MG chewable tablet Chew 1 tablet by mouth as needed for indigestion or heartburn.    . cetirizine (ZYRTEC) 10 MG tablet Take 10 mg by mouth daily.    . Cholecalciferol (VITAMIN D3) 5000 units CAPS Take 1 capsule by mouth daily.    . cyclobenzaprine (FLEXERIL) 10 MG tablet TAKE 1/2-1 TABLET (5-10 MG TOTAL) BY MOUTH 2 (TWO) TIMES DAILY AS NEEDED FOR MUSCLE SPASMS. 30 tablet 0  . diclofenac (VOLTAREN) 75 MG EC tablet TAKE 1 TABLET (75 MG TOTAL) BY MOUTH 2 (TWO) TIMES DAILY. USE AS NEEDED FOR FINGER PAIN 30 tablet 0  . ferrous sulfate 325 (65 FE) MG EC tablet Take 325 mg by mouth daily.    Marland Kitchen ibuprofen (ADVIL) 400 MG tablet TAKE 2 TABLETS BY MOUTH EVERY 6 TO 8 HOURS AS NEEDED 60 tablet 1  . lubiprostone (AMITIZA) 24 MCG capsule Take 1 capsule (24 mcg total) by mouth 2 (two) times daily with a meal. Take twice daily for  IBS - C 60 capsule 6  . Menaquinone-7 (VITAMIN K2) 100 MCG CAPS Take 1 tablet by mouth daily.    . montelukast (SINGULAIR) 10 MG tablet Take 1 tablet (10 mg total) by mouth at bedtime. 90 tablet 3  . predniSONE (DELTASONE) 10 MG tablet TAKE 6 TABS BY MOUTH ON DAY 1; 5 TABS ON DAY 2; 4 TABS ON DAY 3; 3 TABS ON DAY 4; 2 TABS ON DAY 5; 1 TAB ON DAY 6 THEN STOP 21 tablet 0  . UNABLE TO FIND Med Name: cholorophyll  once daily with copper 50mg     .  vitamin B-12 (CYANOCOBALAMIN) 1000 MCG tablet Take 1,000 mcg by mouth daily.     No current facility-administered medications on file prior to visit.    Review of Systems:  As per HPI- otherwise negative.   Physical Examination: Vitals:   10/21/20 1403  BP: (!) 132/58  Pulse: (!) 108  Temp: 98.1 F (36.7 C)  SpO2: 99%   Vitals:   10/21/20 1403  Weight: 143 lb 12.8 oz (65.2 kg)  Height: 5\' 7"  (  1.702 m)   Body mass index is 22.52 kg/m. Ideal Body Weight: Weight in (lb) to have BMI = 25: 159.3  GEN: no acute distress. Slim build, looks well  HEENT: Atraumatic, Normocephalic.  Ears and Nose: No external deformity. CV: RRR, No M/G/R. No JVD. No thrill. No extra heart sounds. PULM: CTA B, no wheezes, crackles, rhonchi. No retractions. No resp. distress. No accessory muscle use. ABD: S, NT, ND, +BS. No rebound. No HSM. EXTR: No c/c/e PSYCH: Normally interactive. Conversant.   BP Readings from Last 3 Encounters:  10/21/20 (!) 132/58  07/09/20 134/62  06/10/20 122/64    Assessment and Plan: GAD (generalized anxiety disorder) - Plan: venlafaxine XR (EFFEXOR XR) 37.5 MG 24 hr capsule  Screening for diabetes mellitus - Plan: Comprehensive metabolic panel, Hemoglobin A1c  Screening for hyperlipidemia - Plan: Lipid panel  Screening for thyroid disorder - Plan: TSH  Screening, deficiency anemia, iron - Plan: CBC  Vitamin D deficiency - Plan: VITAMIN D 25 Hydroxy (Vit-D Deficiency, Fractures)  Nasal congestion - Plan: fluticasone (FLONASE) 50 MCG/ACT nasal spray  B12 deficiency - Plan: Vitamin B12  Night sweats  Here today with a couple concerns.  She has noted night sweats recently.  Most likely this is due to medication, she has no symptoms of tuberculosis and declines screening for same.  We screened her for HIV next year, negative We will have her stop fluoxetine and change to venlafaxine ER.  I have asked her let me know how she responds to this change She has has  noticed some nasal congestion, we suspect allergies.  Suggested Flonase or similar nasal spray Also, will update routine labs for her today- Will plan further follow- up pending labs. Forgot to offer Pap smear, will remind patient with her other labs  This visit occurred during the SARS-CoV-2 public health emergency.  Safety protocols were in place, including screening questions prior to the visit, additional usage of staff PPE, and extensive cleaning of exam room while observing appropriate contact time as indicated for disinfecting solutions.    Signed Lamar Blinks, MD Addendum 5/5 Received labs as below, message to patient Results for orders placed or performed in visit on 10/21/20  CBC  Result Value Ref Range   WBC 8.1 4.0 - 10.5 K/uL   RBC 4.74 3.87 - 5.11 Mil/uL   Platelets 189.0 150.0 - 400.0 K/uL   Hemoglobin 14.5 12.0 - 15.0 g/dL   HCT 42.7 36.0 - 46.0 %   MCV 90.3 78.0 - 100.0 fl   MCHC 33.8 30.0 - 36.0 g/dL   RDW 12.2 11.5 - 15.5 %  Comprehensive metabolic panel  Result Value Ref Range   Sodium 143 135 - 145 mEq/L   Potassium 4.3 3.5 - 5.1 mEq/L   Chloride 106 96 - 112 mEq/L   CO2 30 19 - 32 mEq/L   Glucose, Bld 112 (H) 70 - 99 mg/dL   BUN 11 6 - 23 mg/dL   Creatinine, Ser 0.96 0.40 - 1.20 mg/dL   Total Bilirubin 0.5 0.2 - 1.2 mg/dL   Alkaline Phosphatase 39 39 - 117 U/L   AST 16 0 - 37 U/L   ALT 19 0 - 35 U/L   Total Protein 7.2 6.0 - 8.3 g/dL   Albumin 4.7 3.5 - 5.2 g/dL   GFR 78.20 >60.00 mL/min   Calcium 9.4 8.4 - 10.5 mg/dL  Hemoglobin A1c  Result Value Ref Range   Hgb A1c MFr Bld 5.2 4.6 - 6.5 %  Lipid panel  Result Value Ref Range   Cholesterol 161 0 - 200 mg/dL   Triglycerides 73.0 0.0 - 149.0 mg/dL   HDL 59.70 >39.00 mg/dL   VLDL 14.6 0.0 - 40.0 mg/dL   LDL Cholesterol 87 0 - 99 mg/dL   Total CHOL/HDL Ratio 3    NonHDL 101.36   TSH  Result Value Ref Range   TSH 1.00 0.35 - 4.50 uIU/mL  VITAMIN D 25 Hydroxy (Vit-D Deficiency, Fractures)   Result Value Ref Range   VITD 40.17 30.00 - 100.00 ng/mL  Vitamin B12  Result Value Ref Range   Vitamin B-12 1,221 (H) 211 - 911 pg/mL

## 2020-10-21 ENCOUNTER — Encounter: Payer: Self-pay | Admitting: Family Medicine

## 2020-10-21 ENCOUNTER — Ambulatory Visit: Payer: Managed Care, Other (non HMO) | Admitting: Family Medicine

## 2020-10-21 ENCOUNTER — Other Ambulatory Visit: Payer: Self-pay

## 2020-10-21 ENCOUNTER — Other Ambulatory Visit (HOSPITAL_BASED_OUTPATIENT_CLINIC_OR_DEPARTMENT_OTHER): Payer: Self-pay

## 2020-10-21 VITALS — BP 132/58 | HR 90 | Temp 98.1°F | Ht 67.0 in | Wt 143.8 lb

## 2020-10-21 DIAGNOSIS — E538 Deficiency of other specified B group vitamins: Secondary | ICD-10-CM

## 2020-10-21 DIAGNOSIS — Z13220 Encounter for screening for lipoid disorders: Secondary | ICD-10-CM | POA: Diagnosis not present

## 2020-10-21 DIAGNOSIS — F411 Generalized anxiety disorder: Secondary | ICD-10-CM

## 2020-10-21 DIAGNOSIS — Z1329 Encounter for screening for other suspected endocrine disorder: Secondary | ICD-10-CM

## 2020-10-21 DIAGNOSIS — Z131 Encounter for screening for diabetes mellitus: Secondary | ICD-10-CM | POA: Diagnosis not present

## 2020-10-21 DIAGNOSIS — R61 Generalized hyperhidrosis: Secondary | ICD-10-CM

## 2020-10-21 DIAGNOSIS — Z130 Encounter for screening for diseases of the blood and blood-forming organs and certain disorders involving the immune mechanism: Secondary | ICD-10-CM

## 2020-10-21 DIAGNOSIS — E559 Vitamin D deficiency, unspecified: Secondary | ICD-10-CM | POA: Diagnosis not present

## 2020-10-21 DIAGNOSIS — R0981 Nasal congestion: Secondary | ICD-10-CM

## 2020-10-21 MED ORDER — FLUTICASONE PROPIONATE 50 MCG/ACT NA SUSP
2.0000 | Freq: Every day | NASAL | 12 refills | Status: DC
  Filled 2020-10-21: qty 16, 30d supply, fill #0

## 2020-10-21 MED ORDER — VENLAFAXINE HCL ER 37.5 MG PO CP24
37.5000 mg | ORAL_CAPSULE | Freq: Every day | ORAL | 5 refills | Status: DC
  Filled 2020-10-21: qty 30, 30d supply, fill #0
  Filled 2020-11-18: qty 30, 30d supply, fill #1

## 2020-10-22 ENCOUNTER — Encounter: Payer: Self-pay | Admitting: Family Medicine

## 2020-10-22 LAB — COMPREHENSIVE METABOLIC PANEL
ALT: 19 U/L (ref 0–35)
AST: 16 U/L (ref 0–37)
Albumin: 4.7 g/dL (ref 3.5–5.2)
Alkaline Phosphatase: 39 U/L (ref 39–117)
BUN: 11 mg/dL (ref 6–23)
CO2: 30 mEq/L (ref 19–32)
Calcium: 9.4 mg/dL (ref 8.4–10.5)
Chloride: 106 mEq/L (ref 96–112)
Creatinine, Ser: 0.96 mg/dL (ref 0.40–1.20)
GFR: 78.2 mL/min (ref >60.00)
Glucose, Bld: 112 mg/dL — ABNORMAL HIGH (ref 70–99)
Potassium: 4.3 mEq/L (ref 3.5–5.1)
Sodium: 143 mEq/L (ref 135–145)
Total Bilirubin: 0.5 mg/dL (ref 0.2–1.2)
Total Protein: 7.2 g/dL (ref 6.0–8.3)

## 2020-10-22 LAB — CBC
HCT: 42.7 % (ref 36.0–46.0)
Hemoglobin: 14.5 g/dL (ref 12.0–15.0)
MCHC: 33.8 g/dL (ref 30.0–36.0)
MCV: 90.3 fl (ref 78.0–100.0)
Platelets: 189 10*3/uL (ref 150.0–400.0)
RBC: 4.74 Mil/uL (ref 3.87–5.11)
RDW: 12.2 % (ref 11.5–15.5)
WBC: 8.1 10*3/uL (ref 4.0–10.5)

## 2020-10-22 LAB — VITAMIN B12: Vitamin B-12: 1221 pg/mL — ABNORMAL HIGH (ref 211–911)

## 2020-10-22 LAB — LIPID PANEL
Cholesterol: 161 mg/dL (ref 0–200)
HDL: 59.7 mg/dL (ref >39.00)
LDL Cholesterol: 87 mg/dL (ref 0–99)
NonHDL: 101.36
Total CHOL/HDL Ratio: 3
Triglycerides: 73 mg/dL (ref 0.0–149.0)
VLDL: 14.6 mg/dL (ref 0.0–40.0)

## 2020-10-22 LAB — HEMOGLOBIN A1C: Hgb A1c MFr Bld: 5.2 % (ref 4.6–6.5)

## 2020-10-22 LAB — TSH: TSH: 1 u[IU]/mL (ref 0.35–4.50)

## 2020-10-22 LAB — VITAMIN D 25 HYDROXY (VIT D DEFICIENCY, FRACTURES): VITD: 40.17 ng/mL (ref 30.00–100.00)

## 2020-11-18 ENCOUNTER — Other Ambulatory Visit (HOSPITAL_BASED_OUTPATIENT_CLINIC_OR_DEPARTMENT_OTHER): Payer: Self-pay

## 2020-11-18 MED FILL — Ibuprofen Tab 400 MG: ORAL | 7 days supply | Qty: 60 | Fill #0 | Status: AC

## 2020-12-28 ENCOUNTER — Encounter: Payer: Self-pay | Admitting: Family Medicine

## 2020-12-28 DIAGNOSIS — F411 Generalized anxiety disorder: Secondary | ICD-10-CM

## 2020-12-29 MED ORDER — VENLAFAXINE HCL ER 37.5 MG PO CP24: 37.5000 mg | ORAL_CAPSULE | Freq: Every day | ORAL | 1 refills | Status: DC

## 2021-01-03 NOTE — Progress Notes (Signed)
[facility] at [facility] [address] [place], [place] [address] [phone]  Date:  01/04/2021   Name:  Kimberly Blankenship   DOB:  1985-06-23   MRN:  [id number]  PCP:  Darreld Mclean, MD    Chief Complaint: No chief complaint on file.   History of Present Illness:  Kimberly Blankenship is a 35 y.o. very pleasant female patient who presents with the following:  Virtual visit today to discuss emotional concerns  Patient last seen by myself in May-history of GERD, B12 deficiency, endometriosis and menorrhagia  Patient location is home, my location is office Patient identity confirmed with 2 factors, she gives consent for virtual visit today Time she was taking fluoxetine but noted that she was having bothersome night sweats, a possible side effect We changed her to Effexor- she is taking 37.5 right now  Pt states that she thought she was doing ok, but was feeling more "emotional" than she felt was normal- such as she might have start crying when she does not expect it  She had a copper IUD placed recently- this was placed on 5/11  She has felt unusually emotional for a month or so Her anxiety is under control  Sleep is ok The night sweats are still there - perhaps a bit better than when she was on prozac She may notice these 1-2x a week- so improved   No SI  Patient Active Problem List   Diagnosis Date Noted   Endometriosis determined by laparoscopy 04/25/2019   Dermoid cyst of left ovary 01/08/2019   Hair loss disorder 01/08/2019   Elevated testosterone level in female 01/08/2019   Migraine 12/26/2018   Skin cancer screening 05/28/2018   Anemia 11/03/2017   Metrorrhagia 11/01/2017   GERD (gastroesophageal reflux disease) 04/29/2017   Abnormality of immune system 03/24/2017   Low testosterone level in female 03/24/2017   Panic attacks 02/01/2017   B12 deficiency 01/30/2017   Low blood sugar 01/18/2017   Great toe pain, left  11/03/2016   Fatigue 06/20/2016   Allergy to cockroaches 12/30/2015   GAD (generalized anxiety disorder) 02/18/2014   Chronic neck pain 01/23/2014   Vitamin D deficiency 01/23/2014   Oral contraceptive use 12/25/2012   Allergic rhinitis 06/22/2012   Chronic tension headaches 06/22/2012    Past Medical History:  Diagnosis Date   Anxiety    Depression    Endometriosis determined by laparoscopy 04/25/2019   GERD (gastroesophageal reflux disease)    occasional   Headache, migraine    IBS (irritable bowel syndrome)    IDA (iron deficiency anemia)    Left ovarian cyst     Past Surgical History:  Procedure Laterality Date   LYSIS OF ADHESION N/A 04/25/2019   Procedure: LYSIS OF ADHESION, FULGURATION OF ENDOMETRIOSIS;  Surgeon: Sherlyn Hay, DO;  Location: [facility];  Service: Gynecology;  Laterality: N/A;   NO PAST SURGERIES     wisdom teeth      Social History   Tobacco Use   Smoking status: Never   Smokeless tobacco: Never  Vaping Use   Vaping Use: Never used  Substance Use Topics   Alcohol use: Yes    Comment: occasionally   Drug use: Never    Family History  Problem Relation Age of Onset   Arthritis Mother    Diabetes Mother    Miscarriages / [place] Mother    Fibroids Mother    Depression Brother  Arthritis Maternal Grandmother    Fibroids Maternal Grandmother    Hypertension Maternal Grandfather    Stomach cancer Maternal Grandfather    Arthritis Paternal Grandmother    COPD Paternal Grandmother    Other Paternal Grandfather        Brain tumor   Uterine cancer Paternal Aunt    Colon cancer Neg Hx    Esophageal cancer Neg Hx    Pancreatic cancer Neg Hx    Liver disease Neg Hx     Allergies  Allergen Reactions   Sulfa Antibiotics Itching and Rash    Medication list has been reviewed and updated.  Current Outpatient Medications on File Prior to Visit  Medication Sig Dispense Refill   ALPRAZolam (XANAX) 0.25 MG  tablet Take 1 tablet (0.25 mg total) by mouth 2 (two) times daily as needed for anxiety. 30 tablet 1   Ascorbic Acid (VITAMIN C) 500 MG CAPS Take by mouth daily.     calcium carbonate (TUMS - DOSED IN MG ELEMENTAL CALCIUM) 500 MG chewable tablet Chew 1 tablet by mouth as needed for indigestion or heartburn.     cetirizine (ZYRTEC) 10 MG tablet Take 10 mg by mouth daily.     Cholecalciferol (VITAMIN D3) 5000 units CAPS Take 1 capsule by mouth daily.     cyclobenzaprine (FLEXERIL) 10 MG tablet TAKE 1/2-1 TABLET (5-10 MG TOTAL) BY MOUTH 2 (TWO) TIMES DAILY AS NEEDED FOR MUSCLE SPASMS. 30 tablet 0   diclofenac (VOLTAREN) 75 MG EC tablet TAKE 1 TABLET (75 MG TOTAL) BY MOUTH 2 (TWO) TIMES DAILY. USE AS NEEDED FOR FINGER PAIN 30 tablet 0   ferrous sulfate 325 (65 FE) MG EC tablet Take 325 mg by mouth daily.     fluticasone (FLONASE) 50 MCG/ACT nasal spray Place 2 sprays into both nostrils daily. 16 g 12   ibuprofen (ADVIL) 400 MG tablet TAKE 2 TABLETS BY MOUTH EVERY 6 TO 8 HOURS AS NEEDED 60 tablet 1   lubiprostone (AMITIZA) 24 MCG capsule Take 1 capsule (24 mcg total) by mouth 2 (two) times daily with a meal. Take twice daily for  IBS - C 60 capsule 6   Menaquinone-7 (VITAMIN K2) 100 MCG CAPS Take 1 tablet by mouth daily.     montelukast (SINGULAIR) 10 MG tablet Take 1 tablet (10 mg total) by mouth at bedtime. 90 tablet 3   predniSONE (DELTASONE) 10 MG tablet TAKE 6 TABS BY MOUTH ON DAY 1; 5 TABS ON DAY 2; 4 TABS ON DAY 3; 3 TABS ON DAY 4; 2 TABS ON DAY 5; 1 TAB ON DAY 6 THEN STOP 21 tablet 0   UNABLE TO FIND Med Name: cholorophyll  once daily with copper 50mg      vitamin B-12 (CYANOCOBALAMIN) 1000 MCG tablet Take 1,000 mcg by mouth daily.     No current facility-administered medications on file prior to visit.    Review of Systems:  As per HPI- otherwise negative.   Physical Examination: There were no vitals filed for this visit. There were no vitals filed for this visit. There is no height or  weight on file to calculate BMI. Ideal Body Weight:    Patient observed via video monitor, she looks well and her normal self.  No distress or shortness of breath is noted  Assessment and Plan: GAD (generalized anxiety disorder) - Plan: desvenlafaxine (PRISTIQ) 50 MG 24 hr tablet  Other migraine without status migrainosus, not intractable - Plan: ACETAMINOPHEN-BUTALBITAL 50-325 MG TABS  Virtual visit today  to discuss treatment for anxiety and depression.  Jalisa feels that her depression is under pretty good control, but she is having some unusual strength of motion while on Effexor.  She would like to try different medication.  We will have her changed to Pristiq, she will let me know how this works for her.  We discussed her copper IUD, while it is possible this is causing her particular side effect this seems less likely  She will send me a message with an update in 2 to 3 weeks Also refilled her sumatriptan butalbital use as needed for headaches  Signed Lamar Blinks, MD

## 2021-01-04 ENCOUNTER — Telehealth (INDEPENDENT_AMBULATORY_CARE_PROVIDER_SITE_OTHER): Payer: Managed Care, Other (non HMO) | Admitting: Family Medicine

## 2021-01-04 ENCOUNTER — Other Ambulatory Visit: Payer: Self-pay

## 2021-01-04 DIAGNOSIS — F411 Generalized anxiety disorder: Secondary | ICD-10-CM

## 2021-01-04 DIAGNOSIS — G43809 Other migraine, not intractable, without status migrainosus: Secondary | ICD-10-CM

## 2021-01-04 MED ORDER — BUTALBITAL-ACETAMINOPHEN 50-325 MG PO TABS: ORAL_TABLET | ORAL | 1 refills | Status: DC

## 2021-01-04 MED ORDER — DESVENLAFAXINE SUCCINATE ER 50 MG PO TB24: 50.0000 mg | ORAL_TABLET | Freq: Every day | ORAL | 3 refills | Status: DC

## 2021-01-27 ENCOUNTER — Other Ambulatory Visit: Payer: Self-pay | Admitting: Family Medicine

## 2021-01-27 DIAGNOSIS — F411 Generalized anxiety disorder: Secondary | ICD-10-CM

## 2021-01-27 NOTE — Telephone Encounter (Signed)
Ok to refill 

## 2021-03-09 ENCOUNTER — Ambulatory Visit: Payer: Managed Care, Other (non HMO) | Attending: Internal Medicine

## 2021-03-09 ENCOUNTER — Other Ambulatory Visit (HOSPITAL_BASED_OUTPATIENT_CLINIC_OR_DEPARTMENT_OTHER): Payer: Self-pay

## 2021-03-09 DIAGNOSIS — Z23 Encounter for immunization: Secondary | ICD-10-CM

## 2021-03-09 MED ORDER — TRIAMCINOLONE ACETONIDE 0.1 % EX OINT
TOPICAL_OINTMENT | CUTANEOUS | 1 refills | Status: AC
  Filled 2021-03-09: qty 30, 15d supply, fill #0

## 2021-03-09 MED ORDER — TRETINOIN 0.05 % EX CREA
TOPICAL_CREAM | CUTANEOUS | 3 refills | Status: AC
  Filled 2021-03-09: qty 45, 30d supply, fill #0
  Filled 2021-12-24: qty 45, 30d supply, fill #1
  Filled 2022-02-25: qty 45, 30d supply, fill #2

## 2021-03-09 NOTE — Progress Notes (Signed)
   Covid-19 Vaccination [facility]  Name:  Kimberly Blankenship    MRN: [id number] DOB: Apr 26, 1986  03/09/2021  Kimberly Blankenship was observed post Covid-19 immunization for 15 minutes without incident. She was provided with Vaccine Information Sheet and instruction to access the V-Safe system.   Kimberly Blankenship was instructed to call 911 with any severe reactions post vaccine: Difficulty breathing  Swelling of face and throat  A fast heartbeat  A bad rash all over body  Dizziness and weakness

## 2021-03-10 ENCOUNTER — Other Ambulatory Visit (HOSPITAL_BASED_OUTPATIENT_CLINIC_OR_DEPARTMENT_OTHER): Payer: Self-pay

## 2021-03-16 ENCOUNTER — Other Ambulatory Visit (HOSPITAL_BASED_OUTPATIENT_CLINIC_OR_DEPARTMENT_OTHER): Payer: Self-pay

## 2021-03-16 MED ORDER — COVID-19MRNA BIVAL VACC PFIZER 30 MCG/0.3ML IM SUSP
INTRAMUSCULAR | 0 refills | Status: DC
  Filled 2021-03-16: qty 0.3, 1d supply, fill #0

## 2021-04-16 ENCOUNTER — Ambulatory Visit (INDEPENDENT_AMBULATORY_CARE_PROVIDER_SITE_OTHER): Payer: Managed Care, Other (non HMO) | Admitting: Podiatrist

## 2021-04-16 ENCOUNTER — Other Ambulatory Visit: Payer: Self-pay

## 2021-04-16 DIAGNOSIS — M79674 Pain in right toe(s): Secondary | ICD-10-CM

## 2021-04-16 DIAGNOSIS — M79675 Pain in left toe(s): Secondary | ICD-10-CM

## 2021-04-16 NOTE — Progress Notes (Signed)
Patient presents for orthotic adjustment-  she was recommended by a sports medicine specialist to have some padding added the the IP joint of the left hallux.  I added some blue PPT to this area and gave a piece of felt padding for additional padding to add if needed.  She will let us know if any further adjustments need to be made.

## 2021-04-22 ENCOUNTER — Other Ambulatory Visit (HOSPITAL_BASED_OUTPATIENT_CLINIC_OR_DEPARTMENT_OTHER): Payer: Self-pay

## 2021-04-22 ENCOUNTER — Encounter: Payer: Self-pay | Admitting: Family Medicine

## 2021-04-22 DIAGNOSIS — G43809 Other migraine, not intractable, without status migrainosus: Secondary | ICD-10-CM

## 2021-04-22 MED ORDER — BUTALBITAL-ACETAMINOPHEN 50-325 MG PO TABS
ORAL_TABLET | ORAL | 1 refills | Status: DC
Start: 2021-04-22 — End: 2021-12-24
  Filled 2021-04-22: qty 30, 5d supply, fill #0
  Filled 2021-10-11: qty 30, 5d supply, fill #1

## 2021-05-03 ENCOUNTER — Other Ambulatory Visit (HOSPITAL_BASED_OUTPATIENT_CLINIC_OR_DEPARTMENT_OTHER): Payer: Self-pay

## 2021-05-03 MED ORDER — MELOXICAM 15 MG PO TABS
ORAL_TABLET | ORAL | 0 refills | Status: DC
  Filled 2021-05-03: qty 30, 30d supply, fill #0

## 2021-05-04 ENCOUNTER — Other Ambulatory Visit (HOSPITAL_BASED_OUTPATIENT_CLINIC_OR_DEPARTMENT_OTHER): Payer: Self-pay

## 2021-05-27 ENCOUNTER — Other Ambulatory Visit (HOSPITAL_BASED_OUTPATIENT_CLINIC_OR_DEPARTMENT_OTHER): Payer: Self-pay

## 2021-05-27 ENCOUNTER — Other Ambulatory Visit: Payer: Self-pay

## 2021-05-27 MED ORDER — IBUPROFEN 400 MG PO TABS
ORAL_TABLET | ORAL | 1 refills | Status: AC
  Filled 2021-05-27 – 2021-06-07 (×2): qty 60, 8d supply, fill #0

## 2021-06-03 ENCOUNTER — Other Ambulatory Visit (HOSPITAL_BASED_OUTPATIENT_CLINIC_OR_DEPARTMENT_OTHER): Payer: Self-pay

## 2021-06-07 ENCOUNTER — Other Ambulatory Visit (HOSPITAL_BASED_OUTPATIENT_CLINIC_OR_DEPARTMENT_OTHER): Payer: Self-pay

## 2021-08-03 ENCOUNTER — Other Ambulatory Visit (HOSPITAL_BASED_OUTPATIENT_CLINIC_OR_DEPARTMENT_OTHER): Payer: Self-pay

## 2021-08-03 MED ORDER — MEDROXYPROGESTERONE ACETATE 10 MG PO TABS
ORAL_TABLET | ORAL | 2 refills | Status: DC
  Filled 2021-08-03: qty 5, 5d supply, fill #0

## 2021-09-01 ENCOUNTER — Other Ambulatory Visit (HOSPITAL_BASED_OUTPATIENT_CLINIC_OR_DEPARTMENT_OTHER): Payer: Self-pay

## 2021-09-01 ENCOUNTER — Ambulatory Visit: Payer: Managed Care, Other (non HMO) | Admitting: Family Medicine

## 2021-09-01 ENCOUNTER — Encounter: Payer: Self-pay | Admitting: Family Medicine

## 2021-09-01 VITALS — BP 122/65 | HR 70 | Temp 98.0°F | Ht 67.0 in | Wt 146.6 lb

## 2021-09-01 DIAGNOSIS — J069 Acute upper respiratory infection, unspecified: Secondary | ICD-10-CM | POA: Diagnosis not present

## 2021-09-01 MED ORDER — ALBUTEROL SULFATE HFA 108 (90 BASE) MCG/ACT IN AERS
2.0000 | INHALATION_SPRAY | Freq: Four times a day (QID) | RESPIRATORY_TRACT | 11 refills | Status: AC | PRN
  Filled 2021-09-01: qty 8.5, 25d supply, fill #0
  Filled 2021-12-24: qty 8.5, 25d supply, fill #1
  Filled 2022-06-24: qty 6.7, 20d supply, fill #2

## 2021-09-01 MED ORDER — PREDNISONE 20 MG PO TABS
40.0000 mg | ORAL_TABLET | Freq: Every day | ORAL | 0 refills | Status: AC
  Filled 2021-09-01: qty 10, 5d supply, fill #0

## 2021-09-01 NOTE — Progress Notes (Signed)
? ?Acute Office Visit ? ?Subjective:  ? ? Patient ID: Kimberly Blankenship, female    DOB: [birth year], 36 y.o.   MRN: [id number] ? ?CC: cough, chest congestion ? ? ?HPI ?Patient is in today for cough.  ? ?Patients reports having a cold for about a week, although all symptoms have resolved apart from a rare cough and some chest soreness - states it feels tight like she is trying to breathe in cold air. States she is starting to feel improvement today, but wanted to get checked just in case. She denies any fevers, chills, sore throat, sinus pressure, headaches, chest pain, dyspnea, wheezing, fatigue, GI/GU symptoms, pain with inspiration, productive cough. She has been taking mucinex and OTC cough syrup.  ? ? ? ? ?Past Medical History:  ?Diagnosis Date  ? Anxiety   ? Depression   ? Endometriosis determined by laparoscopy 04/25/2019  ? GERD (gastroesophageal reflux disease)   ? occasional  ? Headache, migraine   ? IBS (irritable bowel syndrome)   ? IDA (iron deficiency anemia)   ? Left ovarian cyst   ? ? ?Past Surgical History:  ?Procedure Laterality Date  ? LYSIS OF ADHESION N/A 04/25/2019  ? Procedure: LYSIS OF ADHESION, FULGURATION OF ENDOMETRIOSIS;  Surgeon: Sherlyn Hay, DO;  Location: [facility];  Service: Gynecology;  Laterality: N/A;  ? NO PAST SURGERIES    ? wisdom teeth    ? ? ?Family History  ?Problem Relation Age of Onset  ? Arthritis Mother   ? Diabetes Mother   ? Miscarriages / [place] Mother   ? Fibroids Mother   ? Depression Brother   ? Arthritis Maternal Grandmother   ? Fibroids Maternal Grandmother   ? Hypertension Maternal Grandfather   ? Stomach cancer Maternal Grandfather   ? Arthritis Paternal Grandmother   ? COPD Paternal Grandmother   ? Other Paternal Grandfather   ?     Brain tumor  ? Uterine cancer Paternal Aunt   ? Colon cancer Neg Hx   ? Esophageal cancer Neg Hx   ? Pancreatic cancer Neg Hx   ? Liver disease Neg Hx   ? ? ?Social History  ? ?Socioeconomic History  ? Marital  status: Married  ?  Spouse name: Shanon Brow  ? Number of children: 0  ? Years of education: Not on file  ? Highest education level: Associate degree: academic program  ?Occupational History  ? Occupation: admin assist  ?Tobacco Use  ? Smoking status: Never  ? Smokeless tobacco: Never  ?Vaping Use  ? Vaping Use: Never used  ?Substance and Sexual Activity  ? Alcohol use: Yes  ?  Comment: occasionally  ? Drug use: Never  ? Sexual activity: Yes  ?  Birth control/protection: None  ?Other Topics Concern  ? Not on file  ?Social History Narrative  ? Patient is right-handed. She livew with her husband in a 2 story home. She drinks 1-2 cups of coffee and 1 cup of tea a day. She does not exercise.   ? ?Social Determinants of Health  ? ?Financial Resource Strain: Not on file  ?Food Insecurity: Not on file  ?Transportation Needs: Not on file  ?Physical Activity: Not on file  ?Stress: Not on file  ?Social Connections: Not on file  ?Intimate Partner Violence: Not on file  ? ? ?Outpatient Medications Prior to Visit  ?Medication Sig Dispense Refill  ? ACETAMINOPHEN-BUTALBITAL 50-325 MG TABS TAKE 1-2 TABLETS EVERY 4 HOURS AS NEEDED FOR HEADACHE. MAX 6 PER  24 HOURS 30 tablet 1  ? ALPRAZolam (XANAX) 0.25 MG tablet Take 1 tablet (0.25 mg total) by mouth 2 (two) times daily as needed for anxiety. 30 tablet 1  ? Ascorbic Acid (VITAMIN C) 500 MG CAPS Take by mouth daily.    ? calcium carbonate (TUMS - DOSED IN MG ELEMENTAL CALCIUM) 500 MG chewable tablet Chew 1 tablet by mouth as needed for indigestion or heartburn.    ? cetirizine (ZYRTEC) 10 MG tablet Take 10 mg by mouth daily.    ? Cholecalciferol (VITAMIN D3) 5000 units CAPS Take 1 capsule by mouth daily.    ? desvenlafaxine (PRISTIQ) 50 MG 24 hr tablet TAKE 1 TABLET BY MOUTH EVERY DAY 90 tablet 2  ? ferrous sulfate 325 (65 FE) MG EC tablet Take 325 mg by mouth daily.    ? fluticasone (FLONASE) 50 MCG/ACT nasal spray Place 2 sprays into both nostrils daily. 16 g 12  ? ibuprofen (ADVIL) 400  MG tablet Take 2 tablets by mouth every 6 - 8 hours as needed 60 tablet 1  ? medroxyPROGESTERone (PROVERA) 10 MG tablet Take 1 tablet by mouth every day  as directed for 5 days. Start 5 days before menses or day 1 of spotting 5 tablet 2  ? Menaquinone-7 (VITAMIN K2) 100 MCG CAPS Take 1 tablet by mouth daily.    ? montelukast (SINGULAIR) 10 MG tablet Take 1 tablet (10 mg total) by mouth at bedtime. 90 tablet 3  ? tretinoin (RETIN-A) 0.05 % cream Apply 1(one) application(s) topical every night at bedtime 45 g 3  ? triamcinolone ointment (KENALOG) 0.1 % Apply 1(one) application(s) topical 2(two) times a day 30 g 1  ? UNABLE TO FIND Med Name: cholorophyll  once daily with copper '50mg'$     ? vitamin B-12 (CYANOCOBALAMIN) 1000 MCG tablet Take 1,000 mcg by mouth daily.    ? COVID-19 mRNA bivalent vaccine, Pfizer, injection Inject into the muscle. 0.3 mL 0  ? lubiprostone (AMITIZA) 24 MCG capsule Take 1 capsule (24 mcg total) by mouth 2 (two) times daily with a meal. Take twice daily for  IBS - C 60 capsule 6  ? meloxicam (MOBIC) 15 MG tablet Take 1 tablet by mouth daily with food for 5 - 7 days, then as needed 30 tablet 0  ? ?No facility-administered medications prior to visit.  ? ? ?Allergies  ?Allergen Reactions  ? Sulfa Antibiotics Itching and Rash  ? ? ?Review of Systems ?All review of systems negative except what is listed in the HPI ? ?   ?Objective:  ?  ?Physical Exam ?Vitals reviewed.  ?Constitutional:   ?   Appearance: Normal appearance. She is normal weight.  ?HENT:  ?   Head: Normocephalic and atraumatic.  ?Cardiovascular:  ?   Rate and Rhythm: Normal rate and regular rhythm.  ?   Pulses: Normal pulses.  ?   Heart sounds: Normal heart sounds.  ?Pulmonary:  ?   Effort: Pulmonary effort is normal.  ?   Breath sounds: Normal breath sounds. No wheezing, rhonchi or rales.  ?Neurological:  ?   General: No focal deficit present.  ?   Mental Status: She is alert and oriented to person, place, and time. Mental status is at  baseline.  ?Psychiatric:     ?   Mood and Affect: Mood normal.     ?   Behavior: Behavior normal.     ?   Thought Content: Thought content normal.     ?  Judgment: Judgment normal.  ? ? ?BP 122/65   Pulse 70   Temp 98 ?F (36.7 ?C)   Ht '5\' 7"'$  (1.702 m)   Wt 146 lb 9.6 oz (66.5 kg)   SpO2 99%   BMI 22.96 kg/m?  ?Wt Readings from Last 3 Encounters:  ?09/01/21 146 lb 9.6 oz (66.5 kg)  ?10/21/20 143 lb 12.8 oz (65.2 kg)  ?07/09/20 142 lb (64.4 kg)  ? ? ?Health Maintenance Due  ?Topic Date Due  ? PAP SMEAR-Modifier  10/11/2020  ? INFLUENZA VACCINE  01/18/2021  ? ? ?There are no preventive care reminders to display for this patient. ? ? ?Lab Results  ?Component Value Date  ? TSH 1.00 10/21/2020  ? ?Lab Results  ?Component Value Date  ? WBC 8.1 10/21/2020  ? HGB 14.5 10/21/2020  ? HCT 42.7 10/21/2020  ? MCV 90.3 10/21/2020  ? PLT 189.0 10/21/2020  ? ?Lab Results  ?Component Value Date  ? NA 143 10/21/2020  ? K 4.3 10/21/2020  ? CO2 30 10/21/2020  ? GLUCOSE 112 (H) 10/21/2020  ? BUN 11 10/21/2020  ? CREATININE 0.96 10/21/2020  ? BILITOT 0.5 10/21/2020  ? ALKPHOS 39 10/21/2020  ? AST 16 10/21/2020  ? ALT 19 10/21/2020  ? PROT 7.2 10/21/2020  ? ALBUMIN 4.7 10/21/2020  ? CALCIUM 9.4 10/21/2020  ? GFR 78.20 10/21/2020  ? ?Lab Results  ?Component Value Date  ? CHOL 161 10/21/2020  ? ?Lab Results  ?Component Value Date  ? HDL 59.70 10/21/2020  ? ?Lab Results  ?Component Value Date  ? [place] 87 10/21/2020  ? ?Lab Results  ?Component Value Date  ? TRIG 73.0 10/21/2020  ? ?Lab Results  ?Component Value Date  ? CHOLHDL 3 10/21/2020  ? ?Lab Results  ?Component Value Date  ? HGBA1C 5.2 10/21/2020  ? ? ?   ?Assessment & Plan:  ? ?1. Viral URI with cough ?Likely a post-viral cough and airway inflammation/spasm. Start with the inhaler as needed. Continue supportive measures including rest, hydration, humidifier use, steam showers, warm compresses to sinuses, warm liquids with lemon and honey, and over-the-counter cough, cold, and  analgesics as needed.   ?If not noticing gradual improvement or if feeling worse, can start the prednisone.  ? ?- albuterol (VENTOLIN HFA) 108 (90 Base) MCG/ACT inhaler; Inhale 2 puffs by mouth into the [facility]

## 2021-09-01 NOTE — Patient Instructions (Signed)
Likely a post-viral cough and airway inflammation/spasm. Start with the inhaler as needed. Continue supportive measures including rest, hydration, humidifier use, steam showers, warm compresses to sinuses, warm liquids with lemon and honey, and over-the-counter cough, cold, and analgesics as needed.   ?If not noticing gradual improvement or if feeling worse, can start the prednisone.  ? ?Please contact office for follow-up if symptoms do not improve or worsen. Seek emergency care if symptoms become severe. ? ?

## 2021-09-06 ENCOUNTER — Other Ambulatory Visit (HOSPITAL_BASED_OUTPATIENT_CLINIC_OR_DEPARTMENT_OTHER): Payer: Self-pay

## 2021-09-06 ENCOUNTER — Encounter: Payer: Self-pay | Admitting: Family Medicine

## 2021-09-06 DIAGNOSIS — F410 Panic disorder [episodic paroxysmal anxiety] without agoraphobia: Secondary | ICD-10-CM

## 2021-09-06 DIAGNOSIS — F411 Generalized anxiety disorder: Secondary | ICD-10-CM

## 2021-09-06 MED ORDER — ALPRAZOLAM 0.25 MG PO TABS
0.2500 mg | ORAL_TABLET | Freq: Two times a day (BID) | ORAL | 1 refills | Status: DC | PRN
  Filled 2021-09-06: qty 30, 15d supply, fill #0

## 2021-10-11 ENCOUNTER — Other Ambulatory Visit (HOSPITAL_BASED_OUTPATIENT_CLINIC_OR_DEPARTMENT_OTHER): Payer: Self-pay

## 2021-10-12 ENCOUNTER — Other Ambulatory Visit (HOSPITAL_BASED_OUTPATIENT_CLINIC_OR_DEPARTMENT_OTHER): Payer: Self-pay

## 2021-10-26 ENCOUNTER — Other Ambulatory Visit: Payer: Self-pay | Admitting: Family Medicine

## 2021-10-26 DIAGNOSIS — F411 Generalized anxiety disorder: Secondary | ICD-10-CM

## 2021-11-14 IMAGING — DX DG HAND COMPLETE 3+V*L*
3 series · 3 of 3 positions shown · non-contrast
Comparison: None.

CLINICAL DATA: Pain at D IP of long finger

EXAM:
LEFT HAND - COMPLETE 3+ VIEW

[hand pa]
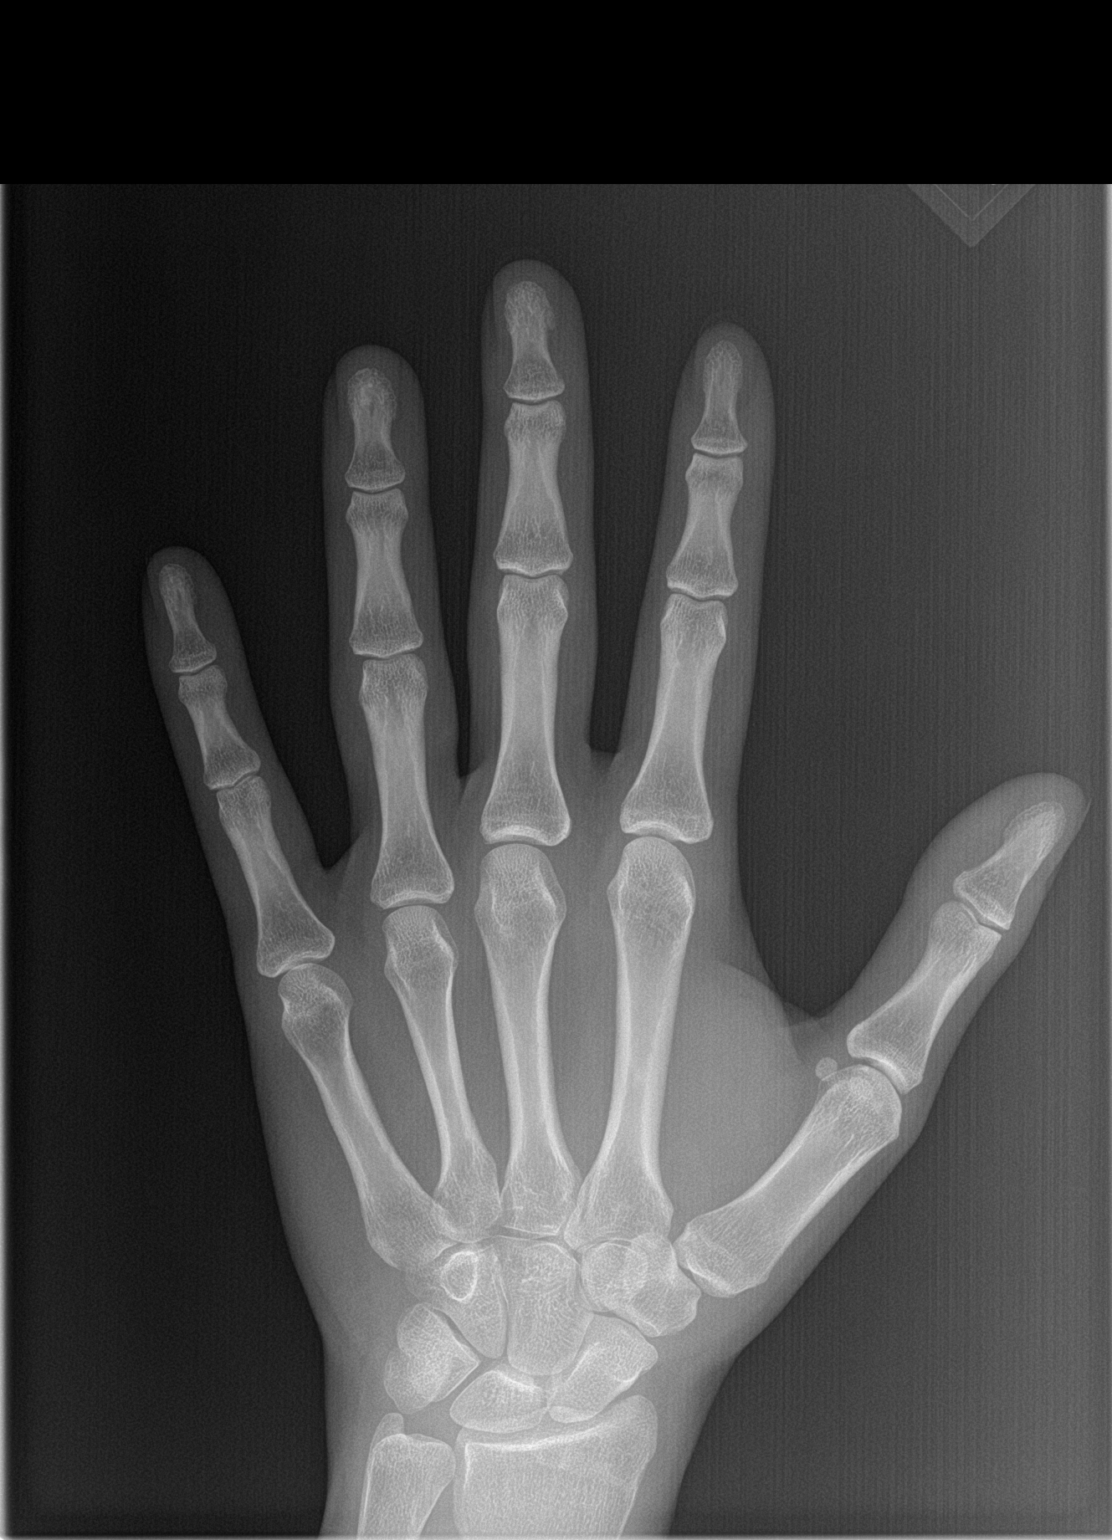

[hand obl]
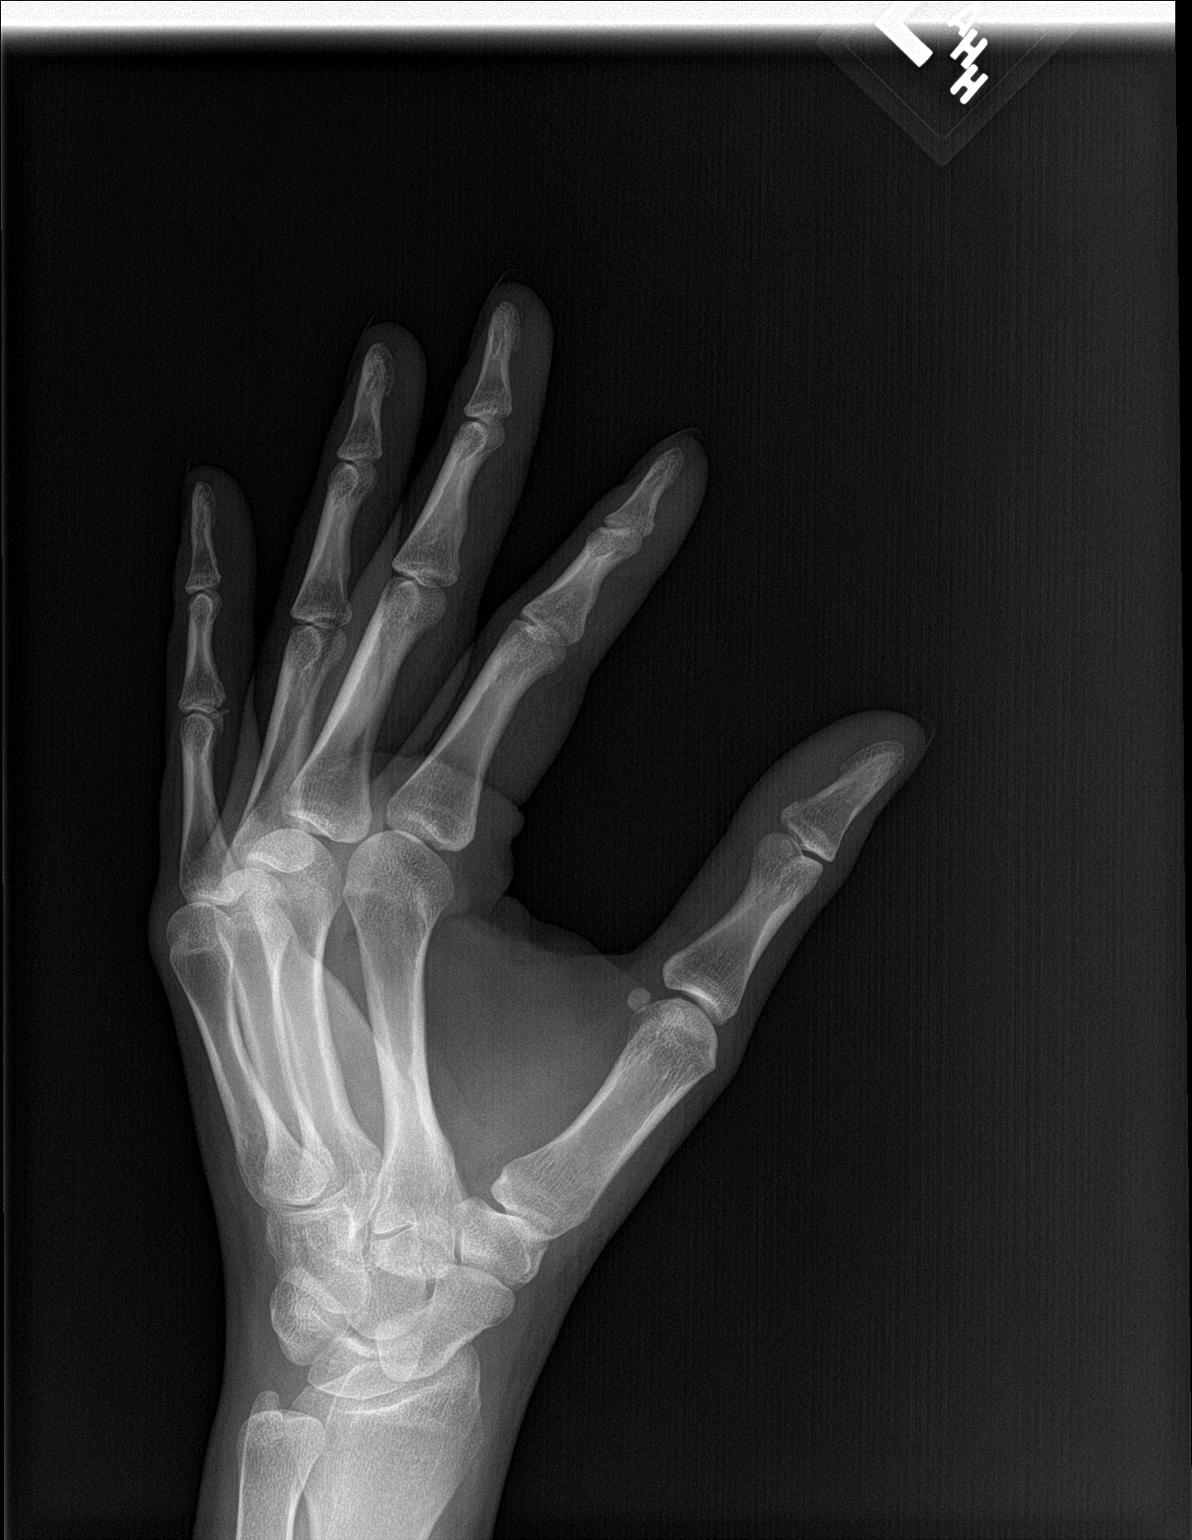

[hand lat]
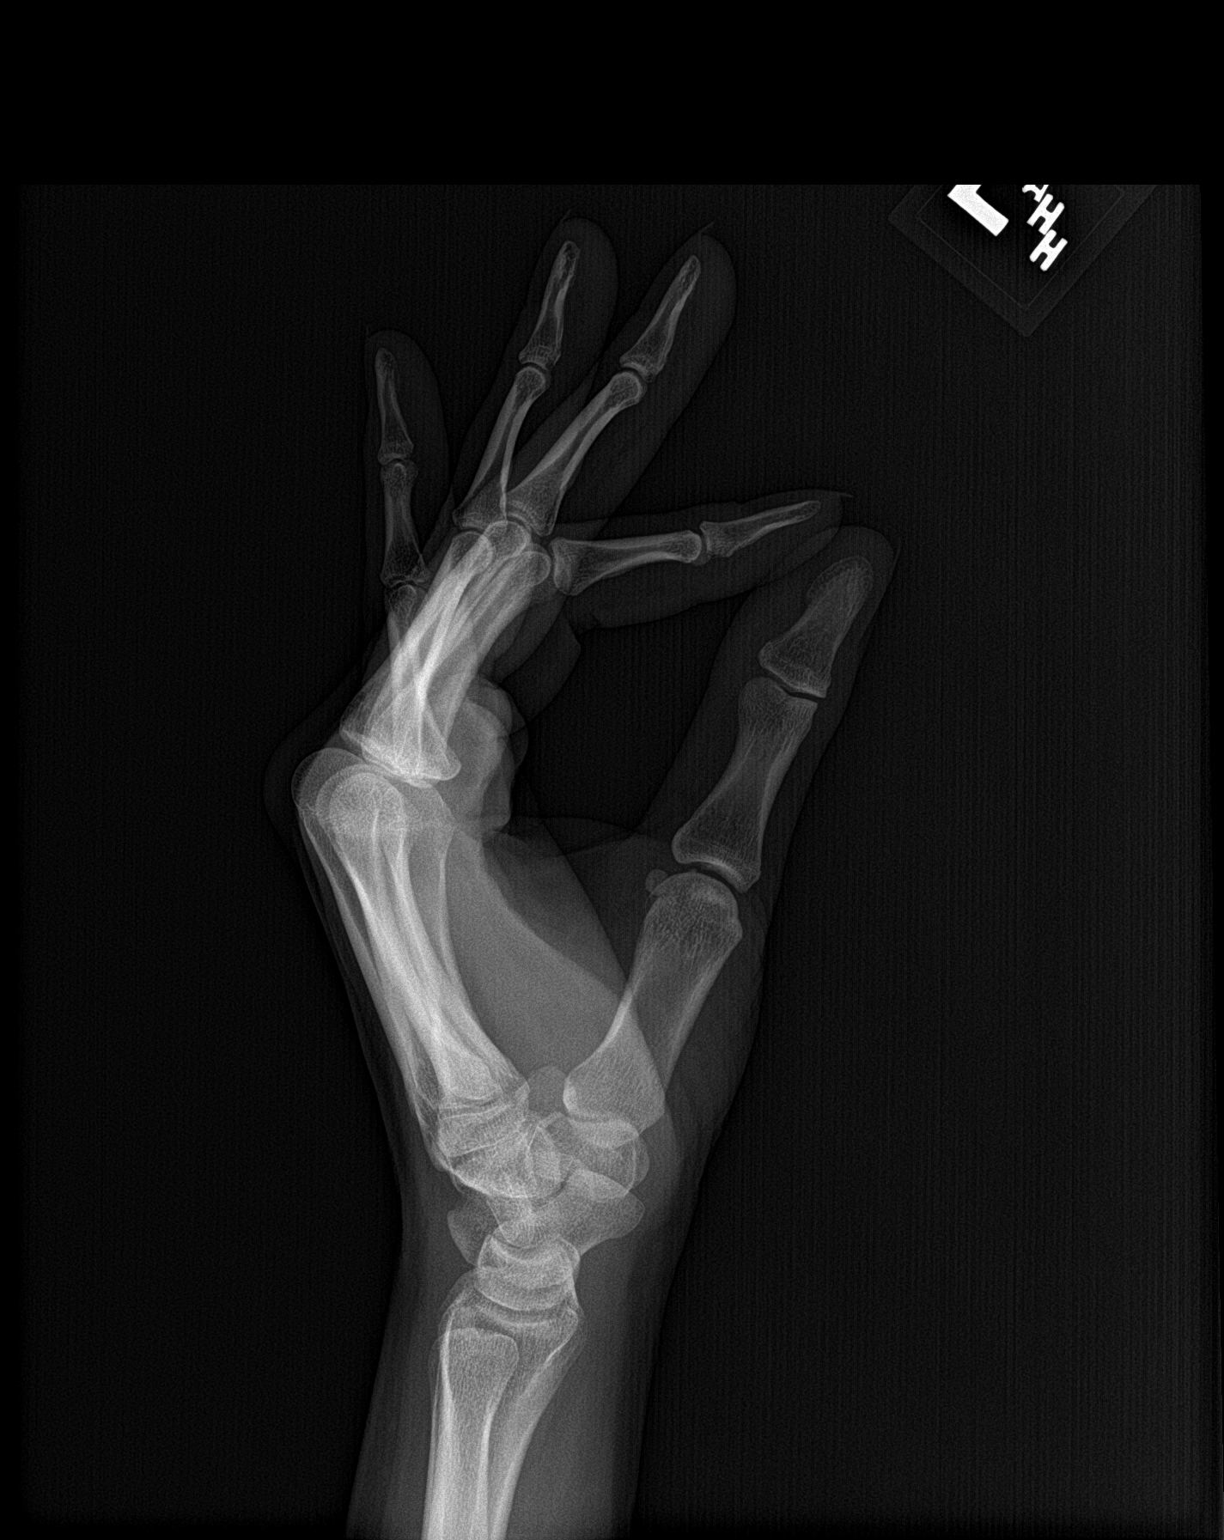

[3 of 3 positions shown; findings below may reference images not displayed]

FINDINGS: There is no evidence of fracture or dislocation. There is no
evidence of arthropathy or other focal bone abnormality. Soft
tissues are unremarkable.
IMPRESSION: Negative.

## 2021-11-18 ENCOUNTER — Other Ambulatory Visit (HOSPITAL_BASED_OUTPATIENT_CLINIC_OR_DEPARTMENT_OTHER): Payer: Self-pay

## 2021-11-18 MED ORDER — PREDNISONE 10 MG PO TABS
ORAL_TABLET | ORAL | 0 refills | Status: DC
  Filled 2021-11-18: qty 21, 6d supply, fill #0

## 2021-12-15 ENCOUNTER — Encounter: Payer: Self-pay | Admitting: Family Medicine

## 2021-12-20 NOTE — Progress Notes (Unsigned)
[facility] [place], [place], [place] [address] [phone]  Date:  12/27/2021   Name:  Kimberly Blankenship   DOB:  [birth year]   MRN:  [id number]  PCP:  Darreld Mclean, MD    Chief Complaint: Travel Consult (To [place]: [place], [place], [place]- asks for a Rx of a Zpack. /Concerns/ questions: f/u on Anxiety)   History of Present Illness:  Kimberly Blankenship is a 36 y.o. very pleasant female patient who presents with the following:  Pt seen today for travel advice- last visit was for a virtual visit about one year ago - history of GERD, B12 deficiency, endometriosis and menorrhagia, anxiety  She is planning to visit several countries in [place] next month- they leave on 8/4 and will be traveling for about 2 weeks She would like to start Hep B and Hep A-she understands that she will not be able to complete this series prior to her trip, but would like to get started.  We don't have childhood vaccination report available  Oral typhoid  Malaria; CBC reviewed, options include Atovaquone-proguanil, doxycycline, mefloquine, tafenoquine3- she would like to use Malorone Would like azithromcyin to take with her in case of traveler's diarrhea which is fine She is doing well with pristiq and alprazolam -uses alprazolam rarely  No chance of current pregnancy  Patient Active Problem List   Diagnosis Date Noted   Endometriosis determined by laparoscopy 04/25/2019   Dermoid cyst of left ovary 01/08/2019   Hair loss disorder 01/08/2019   Elevated testosterone level in female 01/08/2019   Migraine 12/26/2018   Skin cancer screening 05/28/2018   Anemia 11/03/2017   Metrorrhagia 11/01/2017   GERD (gastroesophageal reflux disease) 04/29/2017   Abnormality of immune system 03/24/2017   Low testosterone level in female 03/24/2017   Panic attacks 02/01/2017   B12 deficiency 01/30/2017   Low blood sugar 01/18/2017   Great  toe pain, left 11/03/2016   Fatigue 06/20/2016   Allergy to cockroaches 12/30/2015   GAD (generalized anxiety disorder) 02/18/2014   Chronic neck pain 01/23/2014   Vitamin D deficiency 01/23/2014   Oral contraceptive use 12/25/2012   Allergic rhinitis 06/22/2012   Chronic tension headaches 06/22/2012    Past Medical History:  Diagnosis Date   Anxiety    Depression    Endometriosis determined by laparoscopy 04/25/2019   GERD (gastroesophageal reflux disease)    occasional   Headache, migraine    IBS (irritable bowel syndrome)    IDA (iron deficiency anemia)    Left ovarian cyst     Past Surgical History:  Procedure Laterality Date   LYSIS OF ADHESION N/A 04/25/2019   Procedure: LYSIS OF ADHESION, FULGURATION OF ENDOMETRIOSIS;  Surgeon: Sherlyn Hay, DO;  Location: [facility];  Service: Gynecology;  Laterality: N/A;   NO PAST SURGERIES     wisdom teeth      Social History   Tobacco Use   Smoking status: Never   Smokeless tobacco: Never  Vaping Use   Vaping Use: Never used  Substance Use Topics   Alcohol use: Yes    Comment: occasionally   Drug use: Never    Family History  Problem Relation Age of Onset   Arthritis Mother    Diabetes Mother    Miscarriages / [place] Mother    Fibroids Mother    Depression Brother    Arthritis Maternal Grandmother    Fibroids Maternal Grandmother  Hypertension Maternal Grandfather    Stomach cancer Maternal Grandfather    Arthritis Paternal Grandmother    COPD Paternal Grandmother    Other Paternal Grandfather        Brain tumor   Uterine cancer Paternal Aunt    Colon cancer Neg Hx    Esophageal cancer Neg Hx    Pancreatic cancer Neg Hx    Liver disease Neg Hx     Allergies  Allergen Reactions   Sulfa Antibiotics Itching and Rash    Medication list has been reviewed and updated.  Current Outpatient Medications on File Prior to Visit  Medication Sig Dispense Refill    ACETAMINOPHEN-BUTALBITAL 50-325 MG TABS Take 1-2 tablets by mouth every 4 (four) hours as needed for headaches. MAX 6 PER 24 HOURS. 30 tablet 0   albuterol (VENTOLIN HFA) 108 (90 Base) MCG/ACT inhaler Inhale 2 puffs by mouth into the lungs every 6 (six) hours as needed for wheezing. 17 g 11   ALPRAZolam (XANAX) 0.25 MG tablet Take 1 tablet (0.25 mg total) by mouth 2 (two) times daily as needed for anxiety. 30 tablet 1   Ascorbic Acid (VITAMIN C) 500 MG CAPS Take by mouth daily.     calcium carbonate (TUMS - DOSED IN MG ELEMENTAL CALCIUM) 500 MG chewable tablet Chew 1 tablet by mouth as needed for indigestion or heartburn.     cetirizine (ZYRTEC) 10 MG tablet Take 10 mg by mouth daily.     Cholecalciferol (VITAMIN D3) 5000 units CAPS Take 1 capsule by mouth daily.     desvenlafaxine (PRISTIQ) 50 MG 24 hr tablet TAKE 1 TABLET BY MOUTH EVERY DAY 90 tablet 2   ferrous sulfate 325 (65 FE) MG EC tablet Take 325 mg by mouth daily.     fluticasone (FLONASE) 50 MCG/ACT nasal spray Place 2 sprays into both nostrils daily. 16 g 12   ibuprofen (ADVIL) 400 MG tablet Take 2 tablets by mouth every 6 - 8 hours as needed 60 tablet 1   medroxyPROGESTERone (PROVERA) 10 MG tablet Take 1 tablet by mouth every day  as directed for 5 days. Start 5 days before menses or day 1 of spotting 5 tablet 2   Menaquinone-7 (VITAMIN K2) 100 MCG CAPS Take 1 tablet by mouth daily.     montelukast (SINGULAIR) 10 MG tablet Take 1 tablet (10 mg total) by mouth at bedtime. 90 tablet 3   tretinoin (RETIN-A) 0.05 % cream Apply 1(one) application(s) topical every night at bedtime 45 g 3   triamcinolone ointment (KENALOG) 0.1 % Apply 1(one) application(s) topical 2(two) times a day 30 g 1   UNABLE TO FIND Med Name: cholorophyll  once daily with copper '50mg'$      vitamin B-12 (CYANOCOBALAMIN) 1000 MCG tablet Take 1,000 mcg by mouth daily.     No current facility-administered medications on file prior to visit.    Review of Systems:  As  per HPI- otherwise negative.   Physical Examination: Vitals:   12/27/21 1534  BP: 110/80  Pulse: 72  Resp: 18  Temp: 98.3 F (36.8 C)  SpO2: 98%   Vitals:   12/27/21 1534  Weight: 149 lb 3.2 oz (67.7 kg)  Height: '5\' 7"'$  (1.702 m)   Body mass index is 23.37 kg/m. Ideal Body Weight: Weight in (lb) to have BMI = 25: 159.3  GEN: No acute distress; alert,appropriate. PULM: Breathing comfortably in no respiratory distress PSYCH: Normally interactive.    Assessment and Plan Travel advice encounter - Plan:  atovaquone-proguanil (MALARONE) 250-100 MG TABS tablet, typhoid (VIVOTIF) DR capsule, azithromycin (ZITHROMAX) 250 MG tablet  Immunization due - Plan: Hepatitis A hepatitis B combined vaccine IM  Need for malaria prophylaxis - Plan: atovaquone-proguanil (MALARONE) 250-100 MG TABS tablet  GAD (generalized anxiety disorder)  Patient seen today for prior to a trip to [place].  Gave first dose of Twinrix, provided oral typhoid and malaria prophylaxis Tetanus is up-to-date She will complete her Twinrix series when she returns Anxiety well controlled on current regimen Signed Lamar Blinks, MD

## 2021-12-21 NOTE — Patient Instructions (Addendum)
Have a wonderful time on your trip!  We can complete your hep A and B series at your convenience- first dose today, 2nd dose in 1 month, 3rd dose in 6 months  Complete the oral typhoid vaccine at least a week before you leave Start on Malarone 1-2 days prior to travel, continue for a week once you are home  Remember rabies is prevalent in [place]- avoid contact with any stray or wild mammals/ bats, and if you are bitten please seek care and rabies treatment

## 2021-12-24 ENCOUNTER — Other Ambulatory Visit: Payer: Self-pay | Admitting: Family Medicine

## 2021-12-24 ENCOUNTER — Other Ambulatory Visit (HOSPITAL_BASED_OUTPATIENT_CLINIC_OR_DEPARTMENT_OTHER): Payer: Self-pay

## 2021-12-24 DIAGNOSIS — G43809 Other migraine, not intractable, without status migrainosus: Secondary | ICD-10-CM

## 2021-12-24 MED ORDER — BUTALBITAL-ACETAMINOPHEN 50-325 MG PO TABS
1.0000 | ORAL_TABLET | ORAL | 0 refills | Status: DC | PRN
  Filled 2021-12-24: qty 30, 5d supply, fill #0

## 2021-12-27 ENCOUNTER — Other Ambulatory Visit (HOSPITAL_BASED_OUTPATIENT_CLINIC_OR_DEPARTMENT_OTHER): Payer: Self-pay

## 2021-12-27 ENCOUNTER — Ambulatory Visit: Payer: Managed Care, Other (non HMO) | Admitting: Family Medicine

## 2021-12-27 VITALS — BP 110/80 | HR 72 | Temp 98.3°F | Resp 18 | Ht 67.0 in | Wt 149.2 lb

## 2021-12-27 DIAGNOSIS — F411 Generalized anxiety disorder: Secondary | ICD-10-CM | POA: Diagnosis not present

## 2021-12-27 DIAGNOSIS — Z7184 Encounter for health counseling related to travel: Secondary | ICD-10-CM

## 2021-12-27 DIAGNOSIS — Z23 Encounter for immunization: Secondary | ICD-10-CM

## 2021-12-27 DIAGNOSIS — Z298 Encounter for other specified prophylactic measures: Secondary | ICD-10-CM

## 2021-12-27 MED ORDER — ATOVAQUONE-PROGUANIL HCL 250-100 MG PO TABS
1.0000 | ORAL_TABLET | Freq: Every day | ORAL | 0 refills | Status: DC
  Filled 2021-12-27: qty 30, 30d supply, fill #0

## 2021-12-27 MED ORDER — TYPHOID VACCINE PO CPDR
1.0000 | DELAYED_RELEASE_CAPSULE | ORAL | 0 refills | Status: DC
  Filled 2021-12-27: qty 4, 8d supply, fill #0

## 2021-12-27 MED ORDER — AZITHROMYCIN 250 MG PO TABS
ORAL_TABLET | ORAL | 0 refills | Status: AC
  Filled 2021-12-27: qty 6, 5d supply, fill #0

## 2021-12-28 ENCOUNTER — Other Ambulatory Visit (HOSPITAL_BASED_OUTPATIENT_CLINIC_OR_DEPARTMENT_OTHER): Payer: Self-pay

## 2021-12-29 ENCOUNTER — Other Ambulatory Visit (HOSPITAL_BASED_OUTPATIENT_CLINIC_OR_DEPARTMENT_OTHER): Payer: Self-pay

## 2021-12-30 ENCOUNTER — Ambulatory Visit: Payer: Managed Care, Other (non HMO) | Admitting: Family Medicine

## 2022-02-28 ENCOUNTER — Other Ambulatory Visit (HOSPITAL_BASED_OUTPATIENT_CLINIC_OR_DEPARTMENT_OTHER): Payer: Self-pay

## 2022-03-09 ENCOUNTER — Ambulatory Visit (INDEPENDENT_AMBULATORY_CARE_PROVIDER_SITE_OTHER): Payer: Managed Care, Other (non HMO)

## 2022-03-09 DIAGNOSIS — Z23 Encounter for immunization: Secondary | ICD-10-CM

## 2022-03-09 NOTE — Progress Notes (Signed)
Kimberly Blankenship is a 36 y.o. female presents to the office today for #2 Twinrix  injections, per physician's orders. Original order: We can complete your hep A and B series at your convenience- first dose today, 2nd dose in 1 month, 3rd dose in 6 months  Complete the oral typhoid vaccine at least a week before you leave Twinrix (med),left deltoid  (route) was administer  today. Patient tolerated injection. Patient due for follow up labs/provider appt: , appt made Yes Patient next injection due: 07/08/22, appt made Yes  Ylonda Storr M [place]

## 2022-04-20 ENCOUNTER — Encounter: Payer: Self-pay | Admitting: Family Medicine

## 2022-04-20 DIAGNOSIS — J310 Chronic rhinitis: Secondary | ICD-10-CM

## 2022-04-25 ENCOUNTER — Encounter: Payer: Managed Care, Other (non HMO) | Admitting: Family Medicine

## 2022-05-07 NOTE — Patient Instructions (Incomplete)
It was great to see you again today, I will be in touch with your labs

## 2022-05-07 NOTE — Progress Notes (Unsigned)
[place] at [facility] [address], [place], [place] [address] [phone]  Date:  05/09/2022   Name:  Leasia Swann   DOB:  1986-04-04   MRN:  [id number]  PCP:  Darreld Mclean, MD    Chief Complaint: No chief complaint on file.   History of Present Illness:  Phaedra Colgate is a 36 y.o. very pleasant female patient who presents with the following:  Patient seen today for physical exam- history of GERD, B12 deficiency, endometriosis and menorrhagia, anxiety   Most recent visit with myself was in July; at that time she was planning a trip to [place]  Pap smear screening Flu shot Can update lab work today Patient Active Problem List   Diagnosis Date Noted   Endometriosis determined by laparoscopy 04/25/2019   Dermoid cyst of left ovary 01/08/2019   Hair loss disorder 01/08/2019   Elevated testosterone level in female 01/08/2019   Migraine 12/26/2018   Skin cancer screening 05/28/2018   Anemia 11/03/2017   Metrorrhagia 11/01/2017   GERD (gastroesophageal reflux disease) 04/29/2017   Abnormality of immune system 03/24/2017   Low testosterone level in female 03/24/2017   Panic attacks 02/01/2017   B12 deficiency 01/30/2017   Low blood sugar 01/18/2017   Great toe pain, left 11/03/2016   Fatigue 06/20/2016   Allergy to cockroaches 12/30/2015   GAD (generalized anxiety disorder) 02/18/2014   Chronic neck pain 01/23/2014   Vitamin D deficiency 01/23/2014   Oral contraceptive use 12/25/2012   Allergic rhinitis 06/22/2012   Chronic tension headaches 06/22/2012    Past Medical History:  Diagnosis Date   Anxiety    Depression    Endometriosis determined by laparoscopy 04/25/2019   GERD (gastroesophageal reflux disease)    occasional   Headache, migraine    IBS (irritable bowel syndrome)    IDA (iron deficiency anemia)    Left ovarian cyst     Past Surgical History:  Procedure Laterality Date   LYSIS OF ADHESION  N/A 04/25/2019   Procedure: LYSIS OF ADHESION, FULGURATION OF ENDOMETRIOSIS;  Surgeon: Sherlyn Hay, DO;  Location: [facility];  Service: Gynecology;  Laterality: N/A;   NO PAST SURGERIES     wisdom teeth      Social History   Tobacco Use   Smoking status: Never   Smokeless tobacco: Never  Vaping Use   Vaping Use: Never used  Substance Use Topics   Alcohol use: Yes    Comment: occasionally   Drug use: Never    Family History  Problem Relation Age of Onset   Arthritis Mother    Diabetes Mother    Miscarriages / [place] Mother    Fibroids Mother    Depression Brother    Arthritis Maternal Grandmother    Fibroids Maternal Grandmother    Hypertension Maternal Grandfather    Stomach cancer Maternal Grandfather    Arthritis Paternal Grandmother    COPD Paternal Grandmother    Other Paternal Grandfather        Brain tumor   Uterine cancer Paternal Aunt    Colon cancer Neg Hx    Esophageal cancer Neg Hx    Pancreatic cancer Neg Hx    Liver disease Neg Hx     Allergies  Allergen Reactions   Sulfa Antibiotics Itching and Rash    Medication list has been reviewed and updated.  Current Outpatient Medications on File Prior to Visit  Medication Sig Dispense  Refill   ACETAMINOPHEN-BUTALBITAL 50-325 MG TABS Take 1-2 tablets by mouth every 4 (four) hours as needed for headaches. MAX 6 PER 24 HOURS. 30 tablet 0   albuterol (VENTOLIN HFA) 108 (90 Base) MCG/ACT inhaler Inhale 2 puffs by mouth into the lungs every 6 (six) hours as needed for wheezing. 17 g 11   ALPRAZolam (XANAX) 0.25 MG tablet Take 1 tablet (0.25 mg total) by mouth 2 (two) times daily as needed for anxiety. 30 tablet 1   Ascorbic Acid (VITAMIN C) 500 MG CAPS Take by mouth daily.     atovaquone-proguanil (MALARONE) 250-100 MG TABS tablet Take 1 tablet by mouth daily. Start 1-2 days prior to arrival in country and take daily, continue for one week once back home 30 tablet 0   calcium  carbonate (TUMS - DOSED IN MG ELEMENTAL CALCIUM) 500 MG chewable tablet Chew 1 tablet by mouth as needed for indigestion or heartburn.     cetirizine (ZYRTEC) 10 MG tablet Take 10 mg by mouth daily.     Cholecalciferol (VITAMIN D3) 5000 units CAPS Take 1 capsule by mouth daily.     desvenlafaxine (PRISTIQ) 50 MG 24 hr tablet TAKE 1 TABLET BY MOUTH EVERY DAY 90 tablet 2   ferrous sulfate 325 (65 FE) MG EC tablet Take 325 mg by mouth daily.     fluticasone (FLONASE) 50 MCG/ACT nasal spray Place 2 sprays into both nostrils daily. 16 g 12   ibuprofen (ADVIL) 400 MG tablet Take 2 tablets by mouth every 6 - 8 hours as needed 60 tablet 1   medroxyPROGESTERone (PROVERA) 10 MG tablet Take 1 tablet by mouth every day  as directed for 5 days. Start 5 days before menses or day 1 of spotting 5 tablet 2   Menaquinone-7 (VITAMIN K2) 100 MCG CAPS Take 1 tablet by mouth daily.     montelukast (SINGULAIR) 10 MG tablet Take 1 tablet (10 mg total) by mouth at bedtime. 90 tablet 3   tretinoin (RETIN-A) 0.05 % cream Apply 1(one) application(s) topical every night at bedtime 45 g 3   triamcinolone ointment (KENALOG) 0.1 % Apply 1(one) application(s) topical 2(two) times a day 30 g 1   typhoid (VIVOTIF) DR capsule Take 1 capsule by mouth every other day. 4 capsule 0   UNABLE TO FIND Med Name: cholorophyll  once daily with copper '50mg'$      vitamin B-12 (CYANOCOBALAMIN) 1000 MCG tablet Take 1,000 mcg by mouth daily.     No current facility-administered medications on file prior to visit.    Review of Systems:  As per HPI- otherwise negative.   Physical Examination: There were no vitals filed for this visit. There were no vitals filed for this visit. There is no height or weight on file to calculate BMI. Ideal Body Weight:    GEN: no acute distress. HEENT: Atraumatic, Normocephalic.  Ears and Nose: No external deformity. CV: RRR, No M/G/R. No JVD. No thrill. No extra heart sounds. PULM: CTA B, no wheezes,  crackles, rhonchi. No retractions. No resp. distress. No accessory muscle use. ABD: S, NT, ND, +BS. No rebound. No HSM. EXTR: No c/c/e PSYCH: Normally interactive. Conversant.    Assessment and Plan: *** Physical exam today.  Encouraged healthy diet and exercise routine.further  Signed Lamar Blinks, MD

## 2022-05-09 ENCOUNTER — Encounter: Payer: Self-pay | Admitting: Family Medicine

## 2022-05-09 ENCOUNTER — Ambulatory Visit (INDEPENDENT_AMBULATORY_CARE_PROVIDER_SITE_OTHER): Payer: Managed Care, Other (non HMO) | Admitting: Family Medicine

## 2022-05-09 VITALS — BP 110/60 | HR 73 | Temp 97.8°F | Resp 18 | Ht 67.0 in | Wt 146.0 lb

## 2022-05-09 DIAGNOSIS — E611 Iron deficiency: Secondary | ICD-10-CM

## 2022-05-09 DIAGNOSIS — Z130 Encounter for screening for diseases of the blood and blood-forming organs and certain disorders involving the immune mechanism: Secondary | ICD-10-CM

## 2022-05-09 DIAGNOSIS — Z131 Encounter for screening for diabetes mellitus: Secondary | ICD-10-CM | POA: Diagnosis not present

## 2022-05-09 DIAGNOSIS — E538 Deficiency of other specified B group vitamins: Secondary | ICD-10-CM | POA: Diagnosis not present

## 2022-05-09 DIAGNOSIS — E559 Vitamin D deficiency, unspecified: Secondary | ICD-10-CM

## 2022-05-09 DIAGNOSIS — Z23 Encounter for immunization: Secondary | ICD-10-CM | POA: Diagnosis not present

## 2022-05-09 DIAGNOSIS — Z1329 Encounter for screening for other suspected endocrine disorder: Secondary | ICD-10-CM

## 2022-05-09 DIAGNOSIS — Z0000 Encounter for general adult medical examination without abnormal findings: Secondary | ICD-10-CM

## 2022-05-09 DIAGNOSIS — F411 Generalized anxiety disorder: Secondary | ICD-10-CM | POA: Diagnosis not present

## 2022-05-09 DIAGNOSIS — Z13220 Encounter for screening for lipoid disorders: Secondary | ICD-10-CM | POA: Diagnosis not present

## 2022-05-09 DIAGNOSIS — G43809 Other migraine, not intractable, without status migrainosus: Secondary | ICD-10-CM

## 2022-05-09 LAB — LIPID PANEL
Cholesterol: 170 mg/dL (ref 0–200)
HDL: 65.8 mg/dL (ref >39.00)
LDL Cholesterol: 94 mg/dL (ref 0–99)
NonHDL: 104.39
Total CHOL/HDL Ratio: 3
Triglycerides: 51 mg/dL (ref 0.0–149.0)
VLDL: 10.2 mg/dL (ref 0.0–40.0)

## 2022-05-09 LAB — COMPREHENSIVE METABOLIC PANEL
ALT: 14 U/L (ref 0–35)
AST: 15 U/L (ref 0–37)
Albumin: 4.4 g/dL (ref 3.5–5.2)
Alkaline Phosphatase: 32 U/L — ABNORMAL LOW (ref 39–117)
BUN: 12 mg/dL (ref 6–23)
CO2: 29 mEq/L (ref 19–32)
Calcium: 8.6 mg/dL (ref 8.4–10.5)
Chloride: 101 mEq/L (ref 96–112)
Creatinine, Ser: 0.91 mg/dL (ref 0.40–1.20)
GFR: 82.48 mL/min (ref >60.00)
Glucose, Bld: 80 mg/dL (ref 70–99)
Potassium: 4.1 mEq/L (ref 3.5–5.1)
Sodium: 137 mEq/L (ref 135–145)
Total Bilirubin: 0.7 mg/dL (ref 0.2–1.2)
Total Protein: 6.8 g/dL (ref 6.0–8.3)

## 2022-05-09 LAB — CBC
HCT: 42 % (ref 36.0–46.0)
Hemoglobin: 14.4 g/dL (ref 12.0–15.0)
MCHC: 34.3 g/dL (ref 30.0–36.0)
MCV: 89.7 fl (ref 78.0–100.0)
Platelets: 225 10*3/uL (ref 150.0–400.0)
RBC: 4.69 Mil/uL (ref 3.87–5.11)
RDW: 12.4 % (ref 11.5–15.5)
WBC: 6.6 10*3/uL (ref 4.0–10.5)

## 2022-05-09 LAB — FERRITIN: Ferritin: 20.5 ng/mL (ref 10.0–291.0)

## 2022-05-09 LAB — VITAMIN D 25 HYDROXY (VIT D DEFICIENCY, FRACTURES): VITD: 33.16 ng/mL (ref 30.00–100.00)

## 2022-05-09 LAB — VITAMIN B12: Vitamin B-12: 275 pg/mL (ref 211–911)

## 2022-05-09 LAB — HEMOGLOBIN A1C: Hgb A1c MFr Bld: 5.4 % (ref 4.6–6.5)

## 2022-05-09 LAB — TSH: TSH: 0.93 u[IU]/mL (ref 0.35–5.50)

## 2022-05-11 ENCOUNTER — Other Ambulatory Visit (HOSPITAL_BASED_OUTPATIENT_CLINIC_OR_DEPARTMENT_OTHER): Payer: Self-pay

## 2022-05-11 MED ORDER — RIZATRIPTAN BENZOATE 10 MG PO TABS
10.0000 mg | ORAL_TABLET | ORAL | 11 refills | Status: DC | PRN
  Filled 2022-05-11: qty 9, 5d supply, fill #0

## 2022-05-11 MED ORDER — IPRATROPIUM BROMIDE 0.06 % NA SOLN
NASAL | 11 refills | Status: DC
  Filled 2022-05-11: qty 15, 30d supply, fill #0
  Filled 2022-06-05: qty 15, 30d supply, fill #1

## 2022-05-20 ENCOUNTER — Encounter: Payer: Self-pay | Admitting: Family Medicine

## 2022-05-22 ENCOUNTER — Encounter: Payer: Self-pay | Admitting: Family Medicine

## 2022-05-22 DIAGNOSIS — F411 Generalized anxiety disorder: Secondary | ICD-10-CM

## 2022-05-22 MED ORDER — DESVENLAFAXINE SUCCINATE ER 50 MG PO TB24: 50.0000 mg | ORAL_TABLET | Freq: Every day | ORAL | 3 refills | Status: DC

## 2022-06-03 ENCOUNTER — Other Ambulatory Visit (HOSPITAL_BASED_OUTPATIENT_CLINIC_OR_DEPARTMENT_OTHER): Payer: Self-pay

## 2022-06-03 MED ORDER — COMIRNATY 30 MCG/0.3ML IM SUSY
PREFILLED_SYRINGE | INTRAMUSCULAR | 0 refills | Status: DC
  Filled 2022-06-03: qty 0.3, 1d supply, fill #0

## 2022-06-24 ENCOUNTER — Other Ambulatory Visit (HOSPITAL_BASED_OUTPATIENT_CLINIC_OR_DEPARTMENT_OTHER): Payer: Self-pay

## 2022-07-01 ENCOUNTER — Other Ambulatory Visit (HOSPITAL_BASED_OUTPATIENT_CLINIC_OR_DEPARTMENT_OTHER): Payer: Self-pay

## 2022-07-08 ENCOUNTER — Ambulatory Visit (INDEPENDENT_AMBULATORY_CARE_PROVIDER_SITE_OTHER): Payer: Managed Care, Other (non HMO)

## 2022-07-08 DIAGNOSIS — Z23 Encounter for immunization: Secondary | ICD-10-CM | POA: Diagnosis not present

## 2022-07-08 NOTE — Progress Notes (Signed)
Kimberly Blankenship is a 37 y.o. female presents to the office today for 3rd Twinrix: injection per physician's orders. Original order:  Twinrex (med), 3rd (dose),  IM (route) was administered left (location) today. Patient tolerated injection.

## 2022-08-22 ENCOUNTER — Other Ambulatory Visit (HOSPITAL_BASED_OUTPATIENT_CLINIC_OR_DEPARTMENT_OTHER): Payer: Self-pay

## 2022-08-22 ENCOUNTER — Other Ambulatory Visit: Payer: Self-pay | Admitting: Family Medicine

## 2022-08-22 DIAGNOSIS — G43809 Other migraine, not intractable, without status migrainosus: Secondary | ICD-10-CM

## 2022-08-22 MED ORDER — BUTALBITAL-ACETAMINOPHEN 50-325 MG PO TABS
1.0000 | ORAL_TABLET | ORAL | 1 refills | Status: DC | PRN
  Filled 2022-08-22: qty 30, 3d supply, fill #0
  Filled 2022-12-27: qty 30, 3d supply, fill #1

## 2022-09-08 ENCOUNTER — Ambulatory Visit: Payer: Managed Care, Other (non HMO) | Admitting: Family Medicine

## 2022-09-17 NOTE — Progress Notes (Deleted)
[place] at [facility] [address] [place], [place] [address] [phone]  Date:  09/21/2022   Name:  Kimberly Blankenship   DOB:  May 14, 1986   MRN:  [id number]  PCP:  Darreld Mclean, MD    Chief Complaint: No chief complaint on file.   History of Present Illness:  Kimberly Blankenship is a 37 y.o. very pleasant female patient who presents with the following:  Pt seen today for concern of GERD - also with history of B12 deficiency, endometriosis and menorrhagia, anxiety  Last seen by myself in November   pap  Patient Active Problem List   Diagnosis Date Noted   Endometriosis determined by laparoscopy 04/25/2019   Dermoid cyst of left ovary 01/08/2019   Hair loss disorder 01/08/2019   Elevated testosterone level in female 01/08/2019   Migraine 12/26/2018   Skin cancer screening 05/28/2018   Anemia 11/03/2017   Metrorrhagia 11/01/2017   GERD (gastroesophageal reflux disease) 04/29/2017   Abnormality of immune system 03/24/2017   Low testosterone level in female 03/24/2017   Panic attacks 02/01/2017   B12 deficiency 01/30/2017   Low blood sugar 01/18/2017   Great toe pain, left 11/03/2016   Fatigue 06/20/2016   Allergy to cockroaches 12/30/2015   GAD (generalized anxiety disorder) 02/18/2014   Chronic neck pain 01/23/2014   Vitamin D deficiency 01/23/2014   Oral contraceptive use 12/25/2012   Allergic rhinitis 06/22/2012   Chronic tension headaches 06/22/2012    Past Medical History:  Diagnosis Date   Anxiety    Depression    Endometriosis determined by laparoscopy 04/25/2019   GERD (gastroesophageal reflux disease)    occasional   Headache, migraine    IBS (irritable bowel syndrome)    IDA (iron deficiency anemia)    Left ovarian cyst     Past Surgical History:  Procedure Laterality Date   LYSIS OF ADHESION N/A 04/25/2019   Procedure: LYSIS OF ADHESION, FULGURATION OF ENDOMETRIOSIS;  Surgeon: Sherlyn Hay,  DO;  Location: [facility];  Service: Gynecology;  Laterality: N/A;   NO PAST SURGERIES     wisdom teeth      Social History   Tobacco Use   Smoking status: Never   Smokeless tobacco: Never  Vaping Use   Vaping Use: Never used  Substance Use Topics   Alcohol use: Yes    Comment: occasionally   Drug use: Never    Family History  Problem Relation Age of Onset   Arthritis Mother    Diabetes Mother    Miscarriages / [place] Mother    Fibroids Mother    Depression Brother    Arthritis Maternal Grandmother    Fibroids Maternal Grandmother    Hypertension Maternal Grandfather    Stomach cancer Maternal Grandfather    Arthritis Paternal Grandmother    COPD Paternal Grandmother    Other Paternal Grandfather        Brain tumor   Uterine cancer Paternal Aunt    Colon cancer Neg Hx    Esophageal cancer Neg Hx    Pancreatic cancer Neg Hx    Liver disease Neg Hx     Allergies  Allergen Reactions   Sulfa Antibiotics Itching and Rash    Medication list has been reviewed and updated.  Current Outpatient Medications on File Prior to Visit  Medication Sig Dispense Refill   ACETAMINOPHEN-BUTALBITAL 50-325 MG TABS Take 1-2 tablets by mouth every 4 (four) hours as  needed for headaches. MAX 6 PER 24 HOURS. 30 tablet 1   albuterol (VENTOLIN HFA) 108 (90 Base) MCG/ACT inhaler Inhale 2 puffs by mouth into the lungs every 6 (six) hours as needed for wheezing. 17 g 11   ALPRAZolam (XANAX) 0.25 MG tablet Take 1 tablet (0.25 mg total) by mouth 2 (two) times daily as needed for anxiety. 30 tablet 1   Ascorbic Acid (VITAMIN C) 500 MG CAPS Take by mouth daily.     calcium carbonate (TUMS - DOSED IN MG ELEMENTAL CALCIUM) 500 MG chewable tablet Chew 1 tablet by mouth as needed for indigestion or heartburn.     cetirizine (ZYRTEC) 10 MG tablet Take 10 mg by mouth daily.     Cholecalciferol (VITAMIN D3) 5000 units CAPS Take 1 capsule by mouth daily.     COVID-19 mRNA vaccine  2023-2024 (COMIRNATY) syringe Inject into the muscle. 0.3 mL 0   desvenlafaxine (PRISTIQ) 50 MG 24 hr tablet Take 1 tablet (50 mg total) by mouth daily. 90 tablet 3   ferrous sulfate 325 (65 FE) MG EC tablet Take 325 mg by mouth daily.     fluticasone (FLONASE) 50 MCG/ACT nasal spray Place 2 sprays into both nostrils daily. 16 g 12   ibuprofen (ADVIL) 400 MG tablet Take 2 tablets by mouth every 6 - 8 hours as needed 60 tablet 1   ipratropium (ATROVENT) 0.06 % nasal spray Administer 2 sprays into affected nostril in the morning and at bedtime for 30 days. 15 mL 11   medroxyPROGESTERone (PROVERA) 10 MG tablet Take 1 tablet by mouth every day  as directed for 5 days. Start 5 days before menses or day 1 of spotting 5 tablet 2   Menaquinone-7 (VITAMIN K2) 100 MCG CAPS Take 1 tablet by mouth daily.     montelukast (SINGULAIR) 10 MG tablet Take 1 tablet (10 mg total) by mouth at bedtime. 90 tablet 3   rizatriptan (MAXALT) 10 MG tablet Take 1 tablet (10 mg total) by mouth as needed for Migraine. May repeat in 2 hours if needed 9 tablet 11   tretinoin (RETIN-A) 0.05 % cream Apply 1(one) application(s) topical every night at bedtime 45 g 3   triamcinolone ointment (KENALOG) 0.1 % Apply 1(one) application(s) topical 2(two) times a day 30 g 1   UNABLE TO FIND Med Name: cholorophyll  once daily with copper 50mg      vitamin B-12 (CYANOCOBALAMIN) 1000 MCG tablet Take 1,000 mcg by mouth daily.     No current facility-administered medications on file prior to visit.    Review of Systems:  As per HPI- otherwise negative.   Physical Examination: There were no vitals filed for this visit. There were no vitals filed for this visit. There is no height or weight on file to calculate BMI. Ideal Body Weight:    GEN: no acute distress. HEENT: Atraumatic, Normocephalic.  Ears and Nose: No external deformity. CV: RRR, No M/G/R. No JVD. No thrill. No extra heart sounds. PULM: CTA B, no wheezes, crackles,  rhonchi. No retractions. No resp. distress. No accessory muscle use. ABD: S, NT, ND, +BS. No rebound. No HSM. EXTR: No c/c/e PSYCH: Normally interactive. Conversant.    Assessment and Plan: ***  Signed Lamar Blinks, MD

## 2022-09-21 ENCOUNTER — Ambulatory Visit: Payer: Managed Care, Other (non HMO) | Admitting: Family Medicine

## 2022-09-25 NOTE — Patient Instructions (Incomplete)
It was great to see you again today- I will be in touch with your H pylori Take the Carafate with meals and at bedtime for 10 days Also use the pantoprazole 40 mg daily for 2-4 weeks Please let me know how this treatment seems to work for you  We will also arrange for a circulation test for your feet and toes to make sure your arterial circulation is normal

## 2022-09-25 NOTE — Progress Notes (Unsigned)
[facility] at [facility] [address], [address] [place], [place] [address] [phone]  Date:  09/26/2022   Name:  Ladajah Zahm   DOB:  1986/01/03   MRN:  [id number]  PCP:  Pearline Cables, MD    Chief Complaint: No chief complaint on file.   History of Present Illness:  Willette Lundsten is a 37 y.o. very pleasant female patient who presents with the following:  Patient seen today with concern of acid reflux-she has dealt with this before Most recent visit with myself was in November- history of GERD, B12 deficiency, endometriosis and menorrhagia, anxiety   Patient Active Problem List   Diagnosis Date Noted   Endometriosis determined by laparoscopy 04/25/2019   Dermoid cyst of left ovary 01/08/2019   Hair loss disorder 01/08/2019   Elevated testosterone level in female 01/08/2019   Migraine 12/26/2018   Skin cancer screening 05/28/2018   Anemia 11/03/2017   Metrorrhagia 11/01/2017   GERD (gastroesophageal reflux disease) 04/29/2017   Abnormality of immune system 03/24/2017   Low testosterone level in female 03/24/2017   Panic attacks 02/01/2017   B12 deficiency 01/30/2017   Low blood sugar 01/18/2017   Great toe pain, left 11/03/2016   Fatigue 06/20/2016   Allergy to cockroaches 12/30/2015   GAD (generalized anxiety disorder) 02/18/2014   Chronic neck pain 01/23/2014   Vitamin D deficiency 01/23/2014   Oral contraceptive use 12/25/2012   Allergic rhinitis 06/22/2012   Chronic tension headaches 06/22/2012    Past Medical History:  Diagnosis Date   Anxiety    Depression    Endometriosis determined by laparoscopy 04/25/2019   GERD (gastroesophageal reflux disease)    occasional   Headache, migraine    IBS (irritable bowel syndrome)    IDA (iron deficiency anemia)    Left ovarian cyst     Past Surgical History:  Procedure Laterality Date   LYSIS OF ADHESION N/A 04/25/2019   Procedure: LYSIS OF ADHESION, FULGURATION OF  ENDOMETRIOSIS;  Surgeon: Edwinna Areola, DO;  Location: [facility];  Service: Gynecology;  Laterality: N/A;   NO PAST SURGERIES     wisdom teeth      Social History   Tobacco Use   Smoking status: Never   Smokeless tobacco: Never  Vaping Use   Vaping Use: Never used  Substance Use Topics   Alcohol use: Yes    Comment: occasionally   Drug use: Never    Family History  Problem Relation Age of Onset   Arthritis Mother    Diabetes Mother    Miscarriages / [place] Mother    Fibroids Mother    Depression Brother    Arthritis Maternal Grandmother    Fibroids Maternal Grandmother    Hypertension Maternal Grandfather    Stomach cancer Maternal Grandfather    Arthritis Paternal Grandmother    COPD Paternal Grandmother    Other Paternal Grandfather        Brain tumor   Uterine cancer Paternal Aunt    Colon cancer Neg Hx    Esophageal cancer Neg Hx    Pancreatic cancer Neg Hx    Liver disease Neg Hx     Allergies  Allergen Reactions   Sulfa Antibiotics Itching and Rash    Medication list has been reviewed and updated.  Current Outpatient Medications on File Prior to Visit  Medication Sig Dispense Refill   ACETAMINOPHEN-BUTALBITAL 50-325 MG TABS Take 1-2 tablets by mouth every 4 (  four) hours as needed for headaches. MAX 6 PER 24 HOURS. 30 tablet 1   albuterol (VENTOLIN HFA) 108 (90 Base) MCG/ACT inhaler Inhale 2 puffs by mouth into the lungs every 6 (six) hours as needed for wheezing. 17 g 11   ALPRAZolam (XANAX) 0.25 MG tablet Take 1 tablet (0.25 mg total) by mouth 2 (two) times daily as needed for anxiety. 30 tablet 1   Ascorbic Acid (VITAMIN C) 500 MG CAPS Take by mouth daily.     calcium carbonate (TUMS - DOSED IN MG ELEMENTAL CALCIUM) 500 MG chewable tablet Chew 1 tablet by mouth as needed for indigestion or heartburn.     cetirizine (ZYRTEC) 10 MG tablet Take 10 mg by mouth daily.     Cholecalciferol (VITAMIN D3) 5000 units CAPS Take 1  capsule by mouth daily.     COVID-19 mRNA vaccine 2023-2024 (COMIRNATY) syringe Inject into the muscle. 0.3 mL 0   desvenlafaxine (PRISTIQ) 50 MG 24 hr tablet Take 1 tablet (50 mg total) by mouth daily. 90 tablet 3   ferrous sulfate 325 (65 FE) MG EC tablet Take 325 mg by mouth daily.     fluticasone (FLONASE) 50 MCG/ACT nasal spray Place 2 sprays into both nostrils daily. 16 g 12   ibuprofen (ADVIL) 400 MG tablet Take 2 tablets by mouth every 6 - 8 hours as needed 60 tablet 1   ipratropium (ATROVENT) 0.06 % nasal spray Administer 2 sprays into affected nostril in the morning and at bedtime for 30 days. 15 mL 11   medroxyPROGESTERone (PROVERA) 10 MG tablet Take 1 tablet by mouth every day  as directed for 5 days. Start 5 days before menses or day 1 of spotting 5 tablet 2   Menaquinone-7 (VITAMIN K2) 100 MCG CAPS Take 1 tablet by mouth daily.     montelukast (SINGULAIR) 10 MG tablet Take 1 tablet (10 mg total) by mouth at bedtime. 90 tablet 3   rizatriptan (MAXALT) 10 MG tablet Take 1 tablet (10 mg total) by mouth as needed for Migraine. May repeat in 2 hours if needed 9 tablet 11   tretinoin (RETIN-A) 0.05 % cream Apply 1(one) application(s) topical every night at bedtime 45 g 3   triamcinolone ointment (KENALOG) 0.1 % Apply 1(one) application(s) topical 2(two) times a day 30 g 1   UNABLE TO FIND Med Name: cholorophyll  once daily with copper 50mg      vitamin B-12 (CYANOCOBALAMIN) 1000 MCG tablet Take 1,000 mcg by mouth daily.     No current facility-administered medications on file prior to visit.    Review of Systems:  As per HPI- otherwise negative.   Physical Examination: There were no vitals filed for this visit. There were no vitals filed for this visit. There is no height or weight on file to calculate BMI. Ideal Body Weight:    GEN: no acute distress. HEENT: Atraumatic, Normocephalic.  Ears and Nose: No external deformity. CV: RRR, No M/G/R. No JVD. No thrill. No extra heart  sounds. PULM: CTA B, no wheezes, crackles, rhonchi. No retractions. No resp. distress. No accessory muscle use. ABD: S, NT, ND, +BS. No rebound. No HSM. EXTR: No c/c/e PSYCH: Normally interactive. Conversant.    Assessment and Plan: ***  Signed Abbe Amsterdam, MD

## 2022-09-26 ENCOUNTER — Other Ambulatory Visit (HOSPITAL_BASED_OUTPATIENT_CLINIC_OR_DEPARTMENT_OTHER): Payer: Self-pay

## 2022-09-26 ENCOUNTER — Ambulatory Visit: Payer: Managed Care, Other (non HMO) | Admitting: Family Medicine

## 2022-09-26 VITALS — BP 116/64 | HR 99 | Temp 98.1°F | Resp 18 | Ht 67.0 in | Wt 146.6 lb

## 2022-09-26 DIAGNOSIS — R202 Paresthesia of skin: Secondary | ICD-10-CM

## 2022-09-26 DIAGNOSIS — K219 Gastro-esophageal reflux disease without esophagitis: Secondary | ICD-10-CM | POA: Diagnosis not present

## 2022-09-26 DIAGNOSIS — R200 Anesthesia of skin: Secondary | ICD-10-CM

## 2022-09-26 MED ORDER — PANTOPRAZOLE SODIUM 40 MG PO TBEC
40.0000 mg | DELAYED_RELEASE_TABLET | Freq: Every day | ORAL | 3 refills | Status: DC
  Filled 2022-09-26: qty 30, 30d supply, fill #0
  Filled 2022-11-20: qty 30, 30d supply, fill #1

## 2022-09-26 MED ORDER — SUCRALFATE 1 G PO TABS
1.0000 g | ORAL_TABLET | Freq: Three times a day (TID) | ORAL | 0 refills | Status: DC
  Filled 2022-09-26: qty 40, 10d supply, fill #0

## 2022-09-29 ENCOUNTER — Encounter: Payer: Self-pay | Admitting: Family Medicine

## 2022-09-29 LAB — H. PYLORI BREATH TEST: H. pylori Breath Test: NOT DETECTED

## 2022-10-03 ENCOUNTER — Ambulatory Visit (HOSPITAL_COMMUNITY)
Admission: RE | Admit: 2022-10-03 | Discharge: 2022-10-03 | Disposition: A | Discharge status: Home / Self Care | Payer: Managed Care, Other (non HMO) | Source: Ambulatory Visit | Attending: Family Medicine | Admitting: Family Medicine

## 2022-10-03 DIAGNOSIS — R200 Anesthesia of skin: Secondary | ICD-10-CM | POA: Diagnosis present

## 2022-10-03 DIAGNOSIS — R202 Paresthesia of skin: Secondary | ICD-10-CM

## 2022-10-03 LAB — VAS US ABI WITH/WO TBI
Left ABI: 1.25
Right ABI: 1.22

## 2022-10-04 ENCOUNTER — Encounter: Payer: Self-pay | Admitting: Family Medicine

## 2022-10-05 ENCOUNTER — Ambulatory Visit: Payer: Managed Care, Other (non HMO) | Admitting: Family Medicine

## 2022-10-30 NOTE — Progress Notes (Addendum)
[facility] [address], [address] [place], [place] [address] [phone]  Date:  11/02/2022   Name:  Yamaira Blankenship   DOB:  Nov 29, 1985   MRN:  [id number]  PCP:  Pearline Cables, MD    Chief Complaint: Diarrhea (For about 1 week. Minimal abd pain. )   History of Present Illness:  Kimberly Blankenship is a 37 y.o. very pleasant female patient who presents with the following:  Pt seen today with concern of bowel changes- history of GERD, B12 deficiency, endometriosis and menorrhagia, anxiety    Last seen by myself about 6 weeks ago with GERD sx Tested for H pylori- negative at that time   She notes diarrhea for about one week Can be urgent but not always Sometimes she has cramping but sometimes just loose stools with no other symptoms She did skip her acid medicine for 2 days by accident-she is not sure if this could be related No one else is sick at home, no travel No recent abx She may have 5-6 BM daily- sometimes when she thinks she just has to pee there will be some stool She is normally one BM per day  No blood in the stool and no black stool No vomiting  She is able to eat normally No suspicious foods  No camping, etc.  LMP was a week ago -no suspicion of pregnancy   Patient Active Problem List   Diagnosis Date Noted   Endometriosis determined by laparoscopy 04/25/2019   Dermoid cyst of left ovary 01/08/2019   Hair loss disorder 01/08/2019   Elevated testosterone level in female 01/08/2019   Migraine 12/26/2018   Skin cancer screening 05/28/2018   Anemia 11/03/2017   Metrorrhagia 11/01/2017   GERD (gastroesophageal reflux disease) 04/29/2017   Abnormality of immune system 03/24/2017   Low testosterone level in female 03/24/2017   Panic attacks 02/01/2017   B12 deficiency 01/30/2017   Low blood sugar 01/18/2017   Great toe pain, left 11/03/2016   Fatigue 06/20/2016   Allergy to cockroaches 12/30/2015   GAD  (generalized anxiety disorder) 02/18/2014   Chronic neck pain 01/23/2014   Vitamin D deficiency 01/23/2014   Oral contraceptive use 12/25/2012   Allergic rhinitis 06/22/2012   Chronic tension headaches 06/22/2012    Past Medical History:  Diagnosis Date   Anxiety    Depression    Endometriosis determined by laparoscopy 04/25/2019   GERD (gastroesophageal reflux disease)    occasional   Headache, migraine    IBS (irritable bowel syndrome)    IDA (iron deficiency anemia)    Left ovarian cyst     Past Surgical History:  Procedure Laterality Date   LYSIS OF ADHESION N/A 04/25/2019   Procedure: LYSIS OF ADHESION, FULGURATION OF ENDOMETRIOSIS;  Surgeon: Edwinna Areola, DO;  Location: [facility];  Service: Gynecology;  Laterality: N/A;   NO PAST SURGERIES     wisdom teeth      Social History   Tobacco Use   Smoking status: Never   Smokeless tobacco: Never  Vaping Use   Vaping Use: Never used  Substance Use Topics   Alcohol use: Yes    Comment: occasionally   Drug use: Never    Family History  Problem Relation Age of Onset   Arthritis Mother    Diabetes Mother    Miscarriages / [place] Mother    Fibroids Mother    Depression Brother  Arthritis Maternal Grandmother    Fibroids Maternal Grandmother    Hypertension Maternal Grandfather    Stomach cancer Maternal Grandfather    Arthritis Paternal Grandmother    COPD Paternal Grandmother    Other Paternal Grandfather        Brain tumor   Uterine cancer Paternal Aunt    Colon cancer Neg Hx    Esophageal cancer Neg Hx    Pancreatic cancer Neg Hx    Liver disease Neg Hx     Allergies  Allergen Reactions   Sulfa Antibiotics Itching and Rash    Medication list has been reviewed and updated.  Current Outpatient Medications on File Prior to Visit  Medication Sig Dispense Refill   ACETAMINOPHEN-BUTALBITAL 50-325 MG TABS Take 1-2 tablets by mouth every 4 (four) hours as needed for  headaches. MAX 6 PER 24 HOURS. 30 tablet 1   albuterol (VENTOLIN HFA) 108 (90 Base) MCG/ACT inhaler Inhale 2 puffs by mouth into the lungs every 6 (six) hours as needed for wheezing. 17 g 11   ALPRAZolam (XANAX) 0.25 MG tablet Take 1 tablet (0.25 mg total) by mouth 2 (two) times daily as needed for anxiety. 30 tablet 1   Ascorbic Acid (VITAMIN C) 500 MG CAPS Take by mouth daily.     calcium carbonate (TUMS - DOSED IN MG ELEMENTAL CALCIUM) 500 MG chewable tablet Chew 1 tablet by mouth as needed for indigestion or heartburn.     cetirizine (ZYRTEC) 10 MG tablet Take 10 mg by mouth daily.     Cholecalciferol (VITAMIN D3) 5000 units CAPS Take 1 capsule by mouth daily.     COVID-19 mRNA vaccine 2023-2024 (COMIRNATY) syringe Inject into the muscle. 0.3 mL 0   desvenlafaxine (PRISTIQ) 50 MG 24 hr tablet Take 1 tablet (50 mg total) by mouth daily. 90 tablet 3   ferrous sulfate 325 (65 FE) MG EC tablet Take 325 mg by mouth daily.     fluticasone (FLONASE) 50 MCG/ACT nasal spray Place 2 sprays into both nostrils daily. 16 g 12   ibuprofen (ADVIL) 400 MG tablet Take 2 tablets by mouth every 6 - 8 hours as needed 60 tablet 1   ipratropium (ATROVENT) 0.06 % nasal spray Administer 2 sprays into affected nostril in the morning and at bedtime for 30 days. 15 mL 11   medroxyPROGESTERone (PROVERA) 10 MG tablet Take 1 tablet by mouth every day  as directed for 5 days. Start 5 days before menses or day 1 of spotting 5 tablet 2   Menaquinone-7 (VITAMIN K2) 100 MCG CAPS Take 1 tablet by mouth daily.     montelukast (SINGULAIR) 10 MG tablet Take 1 tablet (10 mg total) by mouth at bedtime. 90 tablet 3   pantoprazole (PROTONIX) 40 MG tablet Take 1 tablet (40 mg total) by mouth daily. 30 tablet 3   rizatriptan (MAXALT) 10 MG tablet Take 1 tablet (10 mg total) by mouth as needed for Migraine. May repeat in 2 hours if needed 9 tablet 11   sucralfate (CARAFATE) 1 g tablet Take 1 tablet (1 g total) by mouth 4 (four) times daily  -  with meals and at bedtime. 40 tablet 0   tretinoin (RETIN-A) 0.05 % cream Apply 1(one) application(s) topical every night at bedtime 45 g 3   triamcinolone ointment (KENALOG) 0.1 % Apply 1(one) application(s) topical 2(two) times a day 30 g 1   UNABLE TO FIND Med Name: cholorophyll  once daily with copper 50mg   vitamin B-12 (CYANOCOBALAMIN) 1000 MCG tablet Take 1,000 mcg by mouth daily.     No current facility-administered medications on file prior to visit.    Review of Systems:  As per HPI- otherwise negative.   Physical Examination: Vitals:   11/02/22 1441  BP: 110/72  Pulse: 92  Resp: 18  Temp: 98.4 F (36.9 C)  SpO2: 96%   Vitals:   11/02/22 1441  Weight: 145 lb 9.6 oz (66 kg)  Height: 5\' 7"  (1.702 m)   Body mass index is 22.8 kg/m. Ideal Body Weight: Weight in (lb) to have BMI = 25: 159.3  GEN: no acute distress.  Normal weight, looks well HEENT: Atraumatic, Normocephalic.  Ears and Nose: No external deformity. CV: RRR, No M/G/R. No JVD. No thrill. No extra heart sounds. PULM: CTA B, no wheezes, crackles, rhonchi. No retractions. No resp. distress. No accessory muscle use. ABD: S, NT, ND, +BS. No rebound. No HSM.  Tiny umbilical hernia, abdomen benign EXTR: No c/c/e PSYCH: Normally interactive. Conversant.    Assessment and Plan: Diarrhea, unspecified type - Plan: Stool Culture, CBC, Comprehensive metabolic panel, Clostridium Difficile by PCR  Patient seen today with concern of diarrhea for about 1 week.  She wonders if this could be related to her use of PPI/pantoprazole.  I advised her I doubt this is related, but if she would like to try holding this medication for few days and see how she responds that is certainly okay.  In the meantime would recommend a probiotic, can try some Imodium as needed if she would like.  If symptoms are getting worse please let me know.  Will check blood work as above, ordered C. difficile and stool culture.  Patient may elect  to hold off on stool sample collection for a few more days in case this may resolve  Signed Abbe Amsterdam, MD  Addendum 5/16, received labs as below.  Message to patient  Results for orders placed or performed in visit on 11/02/22  CBC  Result Value Ref Range   WBC 7.5 4.0 - 10.5 K/uL   RBC 4.67 3.87 - 5.11 Mil/uL   Platelets 216.0 150.0 - 400.0 K/uL   Hemoglobin 14.2 12.0 - 15.0 g/dL   HCT 16.1 09.6 - 04.5 %   MCV 89.8 78.0 - 100.0 fl   MCHC 33.9 30.0 - 36.0 g/dL   RDW 40.9 81.1 - 91.4 %  Comprehensive metabolic panel  Result Value Ref Range   Sodium 139 135 - 145 mEq/L   Potassium 4.2 3.5 - 5.1 mEq/L   Chloride 101 96 - 112 mEq/L   CO2 30 19 - 32 mEq/L   Glucose, Bld 76 70 - 99 mg/dL   BUN 14 6 - 23 mg/dL   Creatinine, Ser 7.82 0.40 - 1.20 mg/dL   Total Bilirubin 0.4 0.2 - 1.2 mg/dL   Alkaline Phosphatase 37 (L) 39 - 117 U/L   AST 16 0 - 37 U/L   ALT 14 0 - 35 U/L   Total Protein 7.1 6.0 - 8.3 g/dL   Albumin 4.6 3.5 - 5.2 g/dL   GFR 95.62 >13.08 mL/min   Calcium 9.3 8.4 - 10.5 mg/dL

## 2022-11-02 ENCOUNTER — Ambulatory Visit: Payer: Managed Care, Other (non HMO) | Admitting: Family Medicine

## 2022-11-02 VITALS — BP 110/72 | HR 92 | Temp 98.4°F | Resp 18 | Ht 67.0 in | Wt 145.6 lb

## 2022-11-02 DIAGNOSIS — R197 Diarrhea, unspecified: Secondary | ICD-10-CM | POA: Diagnosis not present

## 2022-11-02 NOTE — Patient Instructions (Signed)
Good to see you today- I will be in touch with your bloodwork asap If you continue to have diarrhea please complete the stool tests and bring them back to the clinic Ok to use a probiotic OTC for your symptom as well as Imodium as needed  Please let me know if you need anything!

## 2022-11-02 NOTE — Addendum Note (Signed)
Addended by: Rosita Kea on: 11/02/2022 03:12 PM   Modules accepted: Orders

## 2022-11-03 ENCOUNTER — Encounter: Payer: Self-pay | Admitting: Family Medicine

## 2022-11-03 LAB — CBC
HCT: 42 % (ref 36.0–46.0)
Hemoglobin: 14.2 g/dL (ref 12.0–15.0)
MCHC: 33.9 g/dL (ref 30.0–36.0)
MCV: 89.8 fl (ref 78.0–100.0)
Platelets: 216 10*3/uL (ref 150.0–400.0)
RBC: 4.67 Mil/uL (ref 3.87–5.11)
RDW: 12.5 % (ref 11.5–15.5)
WBC: 7.5 10*3/uL (ref 4.0–10.5)

## 2022-11-03 LAB — COMPREHENSIVE METABOLIC PANEL
ALT: 14 U/L (ref 0–35)
AST: 16 U/L (ref 0–37)
Albumin: 4.6 g/dL (ref 3.5–5.2)
Alkaline Phosphatase: 37 U/L — ABNORMAL LOW (ref 39–117)
BUN: 14 mg/dL (ref 6–23)
CO2: 30 mEq/L (ref 19–32)
Calcium: 9.3 mg/dL (ref 8.4–10.5)
Chloride: 101 mEq/L (ref 96–112)
Creatinine, Ser: 0.95 mg/dL (ref 0.40–1.20)
GFR: 78.07 mL/min (ref >60.00)
Glucose, Bld: 76 mg/dL (ref 70–99)
Potassium: 4.2 mEq/L (ref 3.5–5.1)
Sodium: 139 mEq/L (ref 135–145)
Total Bilirubin: 0.4 mg/dL (ref 0.2–1.2)
Total Protein: 7.1 g/dL (ref 6.0–8.3)

## 2022-11-15 ENCOUNTER — Other Ambulatory Visit: Payer: Managed Care, Other (non HMO)

## 2022-11-15 DIAGNOSIS — R197 Diarrhea, unspecified: Secondary | ICD-10-CM

## 2022-11-16 ENCOUNTER — Encounter: Payer: Self-pay | Admitting: Family Medicine

## 2022-11-16 DIAGNOSIS — R197 Diarrhea, unspecified: Secondary | ICD-10-CM

## 2022-11-16 LAB — CLOSTRIDIUM DIFFICILE BY PCR: Toxigenic C. Difficile by PCR: NEGATIVE

## 2022-11-17 LAB — STOOL CULTURE

## 2022-11-17 NOTE — Addendum Note (Signed)
Addended by: Abbe Amsterdam C on: 11/17/2022 05:42 AM   Modules accepted: Orders

## 2022-11-18 ENCOUNTER — Encounter: Payer: Self-pay | Admitting: Family Medicine

## 2022-11-18 LAB — STOOL CULTURE

## 2022-11-19 LAB — STOOL CULTURE: E coli, Shiga toxin Assay: NEGATIVE

## 2022-11-20 ENCOUNTER — Encounter: Payer: Self-pay | Admitting: Family Medicine

## 2022-11-24 NOTE — Telephone Encounter (Signed)
Are you able to check on this referral?  

## 2022-12-28 ENCOUNTER — Other Ambulatory Visit (HOSPITAL_BASED_OUTPATIENT_CLINIC_OR_DEPARTMENT_OTHER): Payer: Self-pay

## 2023-01-02 ENCOUNTER — Other Ambulatory Visit (HOSPITAL_BASED_OUTPATIENT_CLINIC_OR_DEPARTMENT_OTHER): Payer: Self-pay

## 2023-01-02 MED ORDER — DICYCLOMINE HCL 10 MG PO CAPS
10.0000 mg | ORAL_CAPSULE | Freq: Four times a day (QID) | ORAL | 1 refills | Status: DC | PRN
  Filled 2023-01-02: qty 120, 30d supply, fill #0

## 2023-01-30 ENCOUNTER — Other Ambulatory Visit (HOSPITAL_BASED_OUTPATIENT_CLINIC_OR_DEPARTMENT_OTHER): Payer: Self-pay

## 2023-01-30 MED ORDER — KETOCONAZOLE 2 % EX CREA
TOPICAL_CREAM | CUTANEOUS | 11 refills | Status: AC
  Filled 2023-01-30 (×2): qty 60, 30d supply, fill #0

## 2023-02-23 ENCOUNTER — Encounter: Payer: Self-pay | Admitting: Family Medicine

## 2023-02-23 DIAGNOSIS — K219 Gastro-esophageal reflux disease without esophagitis: Secondary | ICD-10-CM

## 2023-02-23 MED ORDER — PANTOPRAZOLE SODIUM 40 MG PO TBEC: 40.0000 mg | DELAYED_RELEASE_TABLET | Freq: Every day | ORAL | 0 refills | Status: DC

## 2023-03-14 ENCOUNTER — Other Ambulatory Visit: Payer: Self-pay | Admitting: Family Medicine

## 2023-03-14 DIAGNOSIS — F410 Panic disorder [episodic paroxysmal anxiety] without agoraphobia: Secondary | ICD-10-CM

## 2023-03-14 DIAGNOSIS — F411 Generalized anxiety disorder: Secondary | ICD-10-CM

## 2023-03-15 ENCOUNTER — Encounter: Payer: Self-pay | Admitting: Family Medicine

## 2023-03-15 ENCOUNTER — Other Ambulatory Visit (HOSPITAL_BASED_OUTPATIENT_CLINIC_OR_DEPARTMENT_OTHER): Payer: Self-pay

## 2023-03-15 MED ORDER — ALPRAZOLAM 0.25 MG PO TABS
0.2500 mg | ORAL_TABLET | Freq: Two times a day (BID) | ORAL | 1 refills | Status: AC | PRN
Start: 2023-03-15 — End: ?
  Filled 2023-03-15: qty 30, 15d supply, fill #0
  Filled 2023-09-08: qty 30, 15d supply, fill #1

## 2023-04-24 ENCOUNTER — Other Ambulatory Visit (HOSPITAL_BASED_OUTPATIENT_CLINIC_OR_DEPARTMENT_OTHER): Payer: Self-pay

## 2023-04-24 ENCOUNTER — Ambulatory Visit: Payer: Managed Care, Other (non HMO) | Admitting: Family Medicine

## 2023-04-24 VITALS — BP 118/80 | HR 90 | Temp 97.8°F | Resp 18 | Ht 67.0 in | Wt 141.0 lb

## 2023-04-24 DIAGNOSIS — J020 Streptococcal pharyngitis: Secondary | ICD-10-CM | POA: Diagnosis not present

## 2023-04-24 DIAGNOSIS — J029 Acute pharyngitis, unspecified: Secondary | ICD-10-CM

## 2023-04-24 LAB — POCT RAPID STREP A (OFFICE): Rapid Strep A Screen: POSITIVE — AB

## 2023-04-24 MED ORDER — PENICILLIN V POTASSIUM 500 MG PO TABS
500.0000 mg | ORAL_TABLET | Freq: Two times a day (BID) | ORAL | 0 refills | Status: DC
  Filled 2023-04-24: qty 20, 10d supply, fill #0

## 2023-04-24 NOTE — Patient Instructions (Addendum)
I am sorry you feel so bad!  Start on penicillin as directed You should feel better within 1-2 days- let me know if this is not the case

## 2023-04-24 NOTE — Progress Notes (Signed)
[facility] [address], [address] [place], [place] [address] [phone]  Date:  04/24/2023   Name:  Kimberly Blankenship   DOB:  1985/12/14   MRN:  [id number]  PCP:  Pearline Cables, MD    Chief Complaint: Sore Throat (X Saturday 04/22/23, swollen glands and congestion. Taking Advil )   History of Present Illness:  [name] is a 37 y.o. very pleasant female patient who presents with the following:  Patient seen today with illness, sore throat Most recent visit with myself was in May when she had a GI illness  - history of GERD, B12 deficiency, endometriosis and menorrhagia, anxiety    She noted a ST 2 days ago  She is not aware of any fever Minor cough from drainage Her glands are painful Husband is perhaps getting sick She was just recently at a theme park in [place]  Patient Active Problem List   Diagnosis Date Noted   Endometriosis determined by laparoscopy 04/25/2019   Dermoid cyst of left ovary 01/08/2019   Hair loss disorder 01/08/2019   Elevated testosterone level in female 01/08/2019   Migraine 12/26/2018   Skin cancer screening 05/28/2018   Anemia 11/03/2017   Metrorrhagia 11/01/2017   GERD (gastroesophageal reflux disease) 04/29/2017   Abnormality of immune system 03/24/2017   Low testosterone level in female 03/24/2017   Panic attacks 02/01/2017   B12 deficiency 01/30/2017   Low blood sugar 01/18/2017   Great toe pain, left 11/03/2016   Fatigue 06/20/2016   Allergy to cockroaches 12/30/2015   GAD (generalized anxiety disorder) 02/18/2014   Chronic neck pain 01/23/2014   Vitamin D deficiency 01/23/2014   Oral contraceptive use 12/25/2012   Allergic rhinitis 06/22/2012   Chronic tension headaches 06/22/2012    Past Medical History:  Diagnosis Date   Anxiety    Depression    Endometriosis determined by laparoscopy 04/25/2019   GERD (gastroesophageal reflux disease)    occasional   Headache,  migraine    IBS (irritable bowel syndrome)    IDA (iron deficiency anemia)    Left ovarian cyst     Past Surgical History:  Procedure Laterality Date   LYSIS OF ADHESION N/A 04/25/2019   Procedure: LYSIS OF ADHESION, FULGURATION OF ENDOMETRIOSIS;  Surgeon: Edwinna Areola, DO;  Location: [facility];  Service: Gynecology;  Laterality: N/A;   NO PAST SURGERIES     wisdom teeth      Social History   Tobacco Use   Smoking status: Never   Smokeless tobacco: Never  Vaping Use   Vaping status: Never Used  Substance Use Topics   Alcohol use: Yes    Comment: occasionally   Drug use: Never    Family History  Problem Relation Age of Onset   Arthritis Mother    Diabetes Mother    Miscarriages / [place] Mother    Fibroids Mother    Depression Brother    Arthritis Maternal Grandmother    Fibroids Maternal Grandmother    Hypertension Maternal Grandfather    Stomach cancer Maternal Grandfather    Arthritis Paternal Grandmother    COPD Paternal Grandmother    Other Paternal Grandfather        Brain tumor   Uterine cancer Paternal Aunt    Colon cancer Neg Hx    Esophageal cancer Neg Hx    Pancreatic cancer Neg Hx    Liver disease Neg Hx  Allergies  Allergen Reactions   Sulfa Antibiotics Itching and Rash    Medication list has been reviewed and updated.  Current Outpatient Medications on File Prior to Visit  Medication Sig Dispense Refill   ACETAMINOPHEN-BUTALBITAL 50-325 MG TABS Take 1-2 tablets by mouth every 4 (four) hours as needed for headaches. MAX 6 PER 24 HOURS. 30 tablet 1   albuterol (VENTOLIN HFA) 108 (90 Base) MCG/ACT inhaler Inhale 2 puffs by mouth into the lungs every 6 (six) hours as needed for wheezing. 17 g 11   ALPRAZolam (XANAX) 0.25 MG tablet Take 1 tablet (0.25 mg total) by mouth 2 (two) times daily as needed for anxiety. 30 tablet 1   Ascorbic Acid (VITAMIN C) 500 MG CAPS Take by mouth daily.     calcium carbonate (TUMS -  DOSED IN MG ELEMENTAL CALCIUM) 500 MG chewable tablet Chew 1 tablet by mouth as needed for indigestion or heartburn.     cetirizine (ZYRTEC) 10 MG tablet Take 10 mg by mouth daily.     Cholecalciferol (VITAMIN D3) 5000 units CAPS Take 1 capsule by mouth daily.     COVID-19 mRNA vaccine 2023-2024 (COMIRNATY) syringe Inject into the muscle. 0.3 mL 0   desvenlafaxine (PRISTIQ) 50 MG 24 hr tablet Take 1 tablet (50 mg total) by mouth daily. 90 tablet 3   dicyclomine (BENTYL) 10 MG capsule Take 1 capsule (10 mg total) by mouth 4 (four) times daily as needed (abdominal cramping w/ diarrhea). 120 capsule 1   ferrous sulfate 325 (65 FE) MG EC tablet Take 325 mg by mouth daily.     fluticasone (FLONASE) 50 MCG/ACT nasal spray Place 2 sprays into both nostrils daily. 16 g 12   ibuprofen (ADVIL) 400 MG tablet Take 2 tablets by mouth every 6 - 8 hours as needed 60 tablet 1   ipratropium (ATROVENT) 0.06 % nasal spray Administer 2 sprays into affected nostril in the morning and at bedtime for 30 days. 15 mL 11   ketoconazole (NIZORAL) 2 % cream Apply to affected area(s) every night at bedtime from neck down to waist and wrist 3 nights in a row and then repeat one night out of each month 60 g 11   medroxyPROGESTERone (PROVERA) 10 MG tablet Take 1 tablet by mouth every day  as directed for 5 days. Start 5 days before menses or day 1 of spotting 5 tablet 2   Menaquinone-7 (VITAMIN K2) 100 MCG CAPS Take 1 tablet by mouth daily.     montelukast (SINGULAIR) 10 MG tablet Take 1 tablet (10 mg total) by mouth at bedtime. 90 tablet 3   pantoprazole (PROTONIX) 40 MG tablet Take 1 tablet (40 mg total) by mouth daily. 90 tablet 0   rizatriptan (MAXALT) 10 MG tablet Take 1 tablet (10 mg total) by mouth as needed for Migraine. May repeat in 2 hours if needed 9 tablet 11   sucralfate (CARAFATE) 1 g tablet Take 1 tablet (1 g total) by mouth 4 (four) times daily -  with meals and at bedtime. 40 tablet 0   tretinoin (RETIN-A) 0.05 %  cream Apply 1(one) application(s) topical every night at bedtime 45 g 3   triamcinolone ointment (KENALOG) 0.1 % Apply 1(one) application(s) topical 2(two) times a day 30 g 1   UNABLE TO FIND Med Name: cholorophyll  once daily with copper 50mg      vitamin B-12 (CYANOCOBALAMIN) 1000 MCG tablet Take 1,000 mcg by mouth daily.     No current facility-administered medications  on file prior to visit.    Review of Systems:  As per HPI- otherwise negative.   Physical Examination: Vitals:   04/24/23 1514  BP: 118/80  Pulse: 90  Resp: 18  Temp: 97.8 F (36.6 C)  SpO2: 99%   Vitals:   04/24/23 1514  Weight: 141 lb (64 kg)  Height: 5\' 7"  (1.702 m)   Body mass index is 22.08 kg/m. Ideal Body Weight: Weight in (lb) to have BMI = 25: 159.3  GEN: no acute distress. Slim build, looks well  HEENT: Atraumatic, Normocephalic.  Bilateral TM wnl, oropharynx injected with exudate bilaterally but no evidence of abscess, uvula midline.  PEERL,EOMI.   Ears and Nose: No external deformity. CV: RRR, No M/G/R. No JVD. No thrill. No extra heart sounds. PULM: CTA B, no wheezes, crackles, rhonchi. No retractions. No resp. distress. No accessory muscle use. EXTR: No c/c/e PSYCH: Normally interactive. Conversant.   Results for orders placed or performed in visit on 04/24/23  POCT rapid strep A  Result Value Ref Range   Rapid Strep A Screen Positive (A) Negative    Assessment and Plan: Strep throat - Plan: penicillin v potassium (VEETID) 500 MG tablet  Pharyngitis, unspecified etiology - Plan: POCT rapid strep A  Strep throat Treat with penicillin  Asked her to let me know if not feeling better in 1-2 days Signed Abbe Amsterdam, MD

## 2023-05-03 ENCOUNTER — Encounter: Payer: Self-pay | Admitting: Family Medicine

## 2023-05-03 ENCOUNTER — Ambulatory Visit: Payer: Managed Care, Other (non HMO) | Admitting: Family Medicine

## 2023-05-03 ENCOUNTER — Other Ambulatory Visit (HOSPITAL_BASED_OUTPATIENT_CLINIC_OR_DEPARTMENT_OTHER): Payer: Self-pay

## 2023-05-03 VITALS — BP 122/60 | HR 75 | Temp 97.9°F | Resp 18

## 2023-05-03 DIAGNOSIS — J029 Acute pharyngitis, unspecified: Secondary | ICD-10-CM | POA: Diagnosis not present

## 2023-05-03 LAB — POCT RAPID STREP A (OFFICE): Rapid Strep A Screen: POSITIVE — AB

## 2023-05-03 MED ORDER — CEFDINIR 300 MG PO CAPS
300.0000 mg | ORAL_CAPSULE | Freq: Two times a day (BID) | ORAL | 0 refills | Status: DC
  Filled 2023-05-03: qty 20, 10d supply, fill #0

## 2023-05-03 NOTE — Progress Notes (Signed)
[facility] [address] [place], [place] [address] [phone]  Date:  05/03/2023   Name:  Kimberly Blankenship   DOB:  [birth year]   MRN:  [id number]  PCP:  Pearline Cables, MD    Chief Complaint: Sore Throat (Pt was seen on 04/24/23. She is here to f/u since her throat is still sore. )   History of Present Illness:  Kimberly Blankenship is a 37 y.o. very pleasant female patient who presents with the following:  Patient seen today for recheck of strep throat.  I saw her in the office on 11/4 at which time she had obvious signs and symptoms of strep throat and tested positive.  We treated her with penicillin for 10 days-she has been taking this consistently.  She was feeling much better but then ST returned over the last couple of days, seems to be just on the right side this time Otherwise she is well, no cough or fever   Patient Active Problem List   Diagnosis Date Noted   Endometriosis determined by laparoscopy 04/25/2019   Dermoid cyst of left ovary 01/08/2019   Hair loss disorder 01/08/2019   Elevated testosterone level in female 01/08/2019   Migraine 12/26/2018   Skin cancer screening 05/28/2018   Anemia 11/03/2017   Metrorrhagia 11/01/2017   GERD (gastroesophageal reflux disease) 04/29/2017   Abnormality of immune system 03/24/2017   Low testosterone level in female 03/24/2017   Panic attacks 02/01/2017   B12 deficiency 01/30/2017   Low blood sugar 01/18/2017   Great toe pain, left 11/03/2016   Fatigue 06/20/2016   Allergy to cockroaches 12/30/2015   GAD (generalized anxiety disorder) 02/18/2014   Chronic neck pain 01/23/2014   Vitamin D deficiency 01/23/2014   Oral contraceptive use 12/25/2012   Allergic rhinitis 06/22/2012   Chronic tension headaches 06/22/2012    Past Medical History:  Diagnosis Date   Anxiety    Depression    Endometriosis determined by laparoscopy 04/25/2019   GERD (gastroesophageal  reflux disease)    occasional   Headache, migraine    IBS (irritable bowel syndrome)    IDA (iron deficiency anemia)    Left ovarian cyst     Past Surgical History:  Procedure Laterality Date   LYSIS OF ADHESION N/A 04/25/2019   Procedure: LYSIS OF ADHESION, FULGURATION OF ENDOMETRIOSIS;  Surgeon: Edwinna Areola, DO;  Location: [facility];  Service: Gynecology;  Laterality: N/A;   NO PAST SURGERIES     wisdom teeth      Social History   Tobacco Use   Smoking status: Never   Smokeless tobacco: Never  Vaping Use   Vaping status: Never Used  Substance Use Topics   Alcohol use: Yes    Comment: occasionally   Drug use: Never    Family History  Problem Relation Age of Onset   Arthritis Mother    Diabetes Mother    Miscarriages / [place] Mother    Fibroids Mother    Depression Brother    Arthritis Maternal Grandmother    Fibroids Maternal Grandmother    Hypertension Maternal Grandfather    Stomach cancer Maternal Grandfather    Arthritis Paternal Grandmother    COPD Paternal Grandmother    Other Paternal Grandfather        Brain tumor   Uterine cancer Paternal Aunt    Colon cancer Neg Hx    Esophageal cancer Neg Hx  Pancreatic cancer Neg Hx    Liver disease Neg Hx     Allergies  Allergen Reactions   Sulfa Antibiotics Itching and Rash    Medication list has been reviewed and updated.  Current Outpatient Medications on File Prior to Visit  Medication Sig Dispense Refill   ACETAMINOPHEN-BUTALBITAL 50-325 MG TABS Take 1-2 tablets by mouth every 4 (four) hours as needed for headaches. MAX 6 PER 24 HOURS. 30 tablet 1   albuterol (VENTOLIN HFA) 108 (90 Base) MCG/ACT inhaler Inhale 2 puffs by mouth into the lungs every 6 (six) hours as needed for wheezing. 17 g 11   ALPRAZolam (XANAX) 0.25 MG tablet Take 1 tablet (0.25 mg total) by mouth 2 (two) times daily as needed for anxiety. 30 tablet 1   Ascorbic Acid (VITAMIN C) 500 MG CAPS Take by  mouth daily.     calcium carbonate (TUMS - DOSED IN MG ELEMENTAL CALCIUM) 500 MG chewable tablet Chew 1 tablet by mouth as needed for indigestion or heartburn.     cetirizine (ZYRTEC) 10 MG tablet Take 10 mg by mouth daily.     Cholecalciferol (VITAMIN D3) 5000 units CAPS Take 1 capsule by mouth daily.     desvenlafaxine (PRISTIQ) 50 MG 24 hr tablet Take 1 tablet (50 mg total) by mouth daily. 90 tablet 3   dicyclomine (BENTYL) 10 MG capsule Take 1 capsule (10 mg total) by mouth 4 (four) times daily as needed (abdominal cramping w/ diarrhea). 120 capsule 1   ferrous sulfate 325 (65 FE) MG EC tablet Take 325 mg by mouth daily.     fluticasone (FLONASE) 50 MCG/ACT nasal spray Place 2 sprays into both nostrils daily. 16 g 12   ibuprofen (ADVIL) 400 MG tablet Take 2 tablets by mouth every 6 - 8 hours as needed 60 tablet 1   ipratropium (ATROVENT) 0.06 % nasal spray Administer 2 sprays into affected nostril in the morning and at bedtime for 30 days. 15 mL 11   ketoconazole (NIZORAL) 2 % cream Apply to affected area(s) every night at bedtime from neck down to waist and wrist 3 nights in a row and then repeat one night out of each month 60 g 11   medroxyPROGESTERone (PROVERA) 10 MG tablet Take 1 tablet by mouth every day  as directed for 5 days. Start 5 days before menses or day 1 of spotting 5 tablet 2   Menaquinone-7 (VITAMIN K2) 100 MCG CAPS Take 1 tablet by mouth daily.     montelukast (SINGULAIR) 10 MG tablet Take 1 tablet (10 mg total) by mouth at bedtime. 90 tablet 3   pantoprazole (PROTONIX) 40 MG tablet Take 1 tablet (40 mg total) by mouth daily. 90 tablet 0   rizatriptan (MAXALT) 10 MG tablet Take 1 tablet (10 mg total) by mouth as needed for Migraine. May repeat in 2 hours if needed 9 tablet 11   sucralfate (CARAFATE) 1 g tablet Take 1 tablet (1 g total) by mouth 4 (four) times daily -  with meals and at bedtime. 40 tablet 0   tretinoin (RETIN-A) 0.05 % cream Apply 1(one) application(s) topical  every night at bedtime 45 g 3   triamcinolone ointment (KENALOG) 0.1 % Apply 1(one) application(s) topical 2(two) times a day 30 g 1   UNABLE TO FIND Med Name: cholorophyll  once daily with copper 50mg      vitamin B-12 (CYANOCOBALAMIN) 1000 MCG tablet Take 1,000 mcg by mouth daily.     No current facility-administered medications  on file prior to visit.    Review of Systems:  As per HPI- otherwise negative.   Physical Examination: Vitals:   05/03/23 1504  BP: 122/60  Pulse: 75  Resp: 18  Temp: 97.9 F (36.6 C)  SpO2: 97%   There were no vitals filed for this visit. There is no height or weight on file to calculate BMI. Ideal Body Weight:    GEN: no acute distress.  Looks well  HEENT: Atraumatic, Normocephalic.  Ears and Nose: No external deformity.  TM wnl bilaterally  CV: RRR, No M/G/R. No JVD. No thrill. No extra heart sounds. PULM: CTA B, no wheezes, crackles, rhonchi. No retractions. No resp. distress. No accessory muscle use. EXTR: No c/c/e PSYCH: Normally interactive. Conversant.  Right side of oropharynx is erythematous with exudate.  Left side appears normal.  Uvula midline, no evidence of abscess   Rapid strep still +   Results for orders placed or performed in visit on 05/03/23  POCT rapid strep A  Result Value Ref Range   Rapid Strep A Screen Positive (A) Negative     Assessment and Plan: Pharyngitis, unspecified etiology - Plan: POCT rapid strep A, cefdinir (OMNICEF) 300 MG capsule  Pt seen today with persistent ST sx and + rapid strep after nearly 10 days of pcn. She notes her sx got a lot better at first but then the ST got worse again the last couple of days Will change over to [facility]- gave 10 days, ok to stop after 5-7 days if sx are resolved.  She will keep me posted about her progress  Signed Abbe Amsterdam, MD

## 2023-05-03 NOTE — Patient Instructions (Signed)
Change over to cefdinir antibiotic for your strep throat- I gave you enough for 10 days.  However if your symptoms resolve you can stop after 5- 7 days.  Please let me know if not responding to the medication!

## 2023-05-14 NOTE — Progress Notes (Unsigned)
[facility] [address] [place], [place] [address] [phone]  Date:  05/17/2023   Name:  Kimberly Blankenship   DOB:  March 28, 1986   MRN:  [id number]  PCP:  Pearline Cables, MD    Chief Complaint: No chief complaint on file.   History of Present Illness:  Kimberly Blankenship is a 37 y.o. very pleasant female patient who presents with the following:  Pt seen today for CPE- history of GERD, B12 deficiency, endometriosis and menorrhagia, anxiety. Migraine headache I saw her earlier this month for strep throat   Pap Mammo- 37 yo but any ?any family history Can update labs if desired- need to check B12   Oral B12 supplement Maxalt as needed Singulair Vitamin D   Patient Active Problem List   Diagnosis Date Noted   Endometriosis determined by laparoscopy 04/25/2019   Dermoid cyst of left ovary 01/08/2019   Hair loss disorder 01/08/2019   Elevated testosterone level in female 01/08/2019   Migraine 12/26/2018   Skin cancer screening 05/28/2018   Anemia 11/03/2017   Metrorrhagia 11/01/2017   GERD (gastroesophageal reflux disease) 04/29/2017   Abnormality of immune system 03/24/2017   Low testosterone level in female 03/24/2017   Panic attacks 02/01/2017   B12 deficiency 01/30/2017   Low blood sugar 01/18/2017   Great toe pain, left 11/03/2016   Fatigue 06/20/2016   Allergy to cockroaches 12/30/2015   GAD (generalized anxiety disorder) 02/18/2014   Chronic neck pain 01/23/2014   Vitamin D deficiency 01/23/2014   Oral contraceptive use 12/25/2012   Allergic rhinitis 06/22/2012   Chronic tension headaches 06/22/2012    Past Medical History:  Diagnosis Date   Anxiety    Depression    Endometriosis determined by laparoscopy 04/25/2019   GERD (gastroesophageal reflux disease)    occasional   Headache, migraine    IBS (irritable bowel syndrome)    IDA (iron deficiency anemia)    Left ovarian cyst     Past  Surgical History:  Procedure Laterality Date   LYSIS OF ADHESION N/A 04/25/2019   Procedure: LYSIS OF ADHESION, FULGURATION OF ENDOMETRIOSIS;  Surgeon: Edwinna Areola, DO;  Location:  [facility];  Service: Gynecology;  Laterality: N/A;   NO PAST SURGERIES     wisdom teeth      Social History   Tobacco Use   Smoking status: Never   Smokeless tobacco: Never  Vaping Use   Vaping status: Never Used  Substance Use Topics   Alcohol use: Yes    Comment: occasionally   Drug use: Never    Family History  Problem Relation Age of Onset   Arthritis Mother    Diabetes Mother    Miscarriages / [place] Mother    Fibroids Mother    Depression Brother    Arthritis Maternal Grandmother    Fibroids Maternal Grandmother    Hypertension Maternal Grandfather    Stomach cancer Maternal Grandfather    Arthritis Paternal Grandmother    COPD Paternal Grandmother    Other Paternal Grandfather        Brain tumor   Uterine cancer Paternal Aunt    Colon cancer Neg Hx    Esophageal cancer Neg Hx    Pancreatic cancer Neg Hx    Liver disease Neg Hx     Allergies  Allergen Reactions   Sulfa Antibiotics Itching and Rash    Medication list has been reviewed and updated.  Current Outpatient Medications on File Prior to Visit  Medication Sig Dispense Refill   ACETAMINOPHEN-BUTALBITAL 50-325 MG TABS Take 1-2 tablets by mouth every 4 (four) hours as needed for headaches. MAX 6 PER 24 HOURS. 30 tablet 1   albuterol (VENTOLIN HFA) 108 (90 Base) MCG/ACT inhaler Inhale 2 puffs by mouth into the lungs every 6 (six) hours as needed for wheezing. 17 g 11   ALPRAZolam (XANAX) 0.25 MG tablet Take 1 tablet (0.25 mg total) by mouth 2 (two) times daily as needed for anxiety. 30 tablet 1   Ascorbic Acid (VITAMIN C) 500 MG CAPS Take by mouth daily.     calcium carbonate (TUMS - DOSED IN MG ELEMENTAL CALCIUM) 500 MG chewable tablet Chew 1 tablet by mouth as needed for indigestion or  heartburn.     cefdinir (OMNICEF) 300 MG capsule Take 1 capsule (300 mg total) by mouth 2 (two) times daily. 20 capsule 0   cetirizine (ZYRTEC) 10 MG tablet Take 10 mg by mouth daily.     Cholecalciferol (VITAMIN D3) 5000 units CAPS Take 1 capsule by mouth daily.     desvenlafaxine (PRISTIQ) 50 MG 24 hr tablet Take 1 tablet (50 mg total) by mouth daily. 90 tablet 3   dicyclomine (BENTYL) 10 MG capsule Take 1 capsule (10 mg total) by mouth 4 (four) times daily as needed (abdominal cramping w/ diarrhea). 120 capsule 1   ferrous sulfate 325 (65 FE) MG EC tablet Take 325 mg by mouth daily.     fluticasone (FLONASE) 50 MCG/ACT nasal spray Place 2 sprays into both nostrils daily. 16 g 12   ibuprofen (ADVIL) 400 MG tablet Take 2 tablets by mouth every 6 - 8 hours as needed 60 tablet 1   ipratropium (ATROVENT) 0.06 % nasal spray Administer 2 sprays into affected nostril in the morning and at bedtime for 30 days. 15 mL 11   ketoconazole (NIZORAL) 2 % cream Apply to affected area(s) every night at bedtime from neck down to waist and wrist 3 nights in a row and then repeat one night out of each month 60 g 11   medroxyPROGESTERone (PROVERA) 10 MG tablet Take 1 tablet by mouth every day  as directed for 5 days. Start 5 days before menses or day 1 of spotting 5 tablet 2   Menaquinone-7 (VITAMIN K2) 100 MCG CAPS Take 1 tablet by mouth daily.     montelukast (SINGULAIR) 10 MG tablet Take 1 tablet (10 mg total) by mouth at bedtime. 90 tablet 3   pantoprazole (PROTONIX) 40 MG tablet Take 1 tablet (40 mg total) by mouth daily. 90 tablet 0   rizatriptan (MAXALT) 10 MG tablet Take 1 tablet (10 mg total) by mouth as needed for Migraine. May repeat in 2 hours if needed 9 tablet 11   sucralfate (CARAFATE) 1 g tablet Take 1 tablet (1 g total) by mouth 4 (four) times daily -  with meals and at bedtime. 40 tablet 0   tretinoin (RETIN-A) 0.05 % cream Apply 1(one) application(s) topical every night at bedtime 45 g 3    triamcinolone ointment (KENALOG) 0.1 % Apply 1(one) application(s) topical 2(two) times a day 30 g 1   UNABLE TO FIND Med Name: cholorophyll  once daily with copper 50mg      vitamin B-12 (CYANOCOBALAMIN) 1000 MCG tablet Take 1,000 mcg by mouth daily.     No current facility-administered medications on file prior to visit.    Review of Systems:  As per HPI-  otherwise negative.   Physical Examination: There were no vitals filed for this visit. There were no vitals filed for this visit. There is no height or weight on file to calculate BMI. Ideal Body Weight:    GEN: no acute distress. HEENT: Atraumatic, Normocephalic.  Ears and Nose: No external deformity. CV: RRR, No M/G/R. No JVD. No thrill. No extra heart sounds. PULM: CTA B, no wheezes, crackles, rhonchi. No retractions. No resp. distress. No accessory muscle use. ABD: S, NT, ND, +BS. No rebound. No HSM. EXTR: No c/c/e PSYCH: Normally interactive. Conversant.    Assessment and Plan: *** Physical exam- encouraged healthy diet and exercise routine Will plan further follow- up pending labs.  Signed Abbe Amsterdam, MD

## 2023-05-14 NOTE — Patient Instructions (Signed)
It was great to see you again today -I will be in touch with your labs as soon as possible Please schedule your Pap smear at your earliest convenience.  Like we mentioned, you could consider a baseline mammogram at age 37 if you wish.

## 2023-05-17 ENCOUNTER — Ambulatory Visit (INDEPENDENT_AMBULATORY_CARE_PROVIDER_SITE_OTHER): Payer: Managed Care, Other (non HMO) | Admitting: Family Medicine

## 2023-05-17 ENCOUNTER — Encounter: Payer: Self-pay | Admitting: Family Medicine

## 2023-05-17 VITALS — BP 90/72 | HR 69 | Temp 98.1°F | Resp 18 | Ht 67.0 in | Wt 140.0 lb

## 2023-05-17 DIAGNOSIS — Z1329 Encounter for screening for other suspected endocrine disorder: Secondary | ICD-10-CM | POA: Diagnosis not present

## 2023-05-17 DIAGNOSIS — Z13220 Encounter for screening for lipoid disorders: Secondary | ICD-10-CM | POA: Diagnosis not present

## 2023-05-17 DIAGNOSIS — E538 Deficiency of other specified B group vitamins: Secondary | ICD-10-CM | POA: Diagnosis not present

## 2023-05-17 DIAGNOSIS — F411 Generalized anxiety disorder: Secondary | ICD-10-CM | POA: Diagnosis not present

## 2023-05-17 DIAGNOSIS — Z0000 Encounter for general adult medical examination without abnormal findings: Secondary | ICD-10-CM

## 2023-05-17 DIAGNOSIS — R5383 Other fatigue: Secondary | ICD-10-CM

## 2023-05-17 DIAGNOSIS — Z131 Encounter for screening for diabetes mellitus: Secondary | ICD-10-CM | POA: Diagnosis not present

## 2023-05-17 DIAGNOSIS — G43809 Other migraine, not intractable, without status migrainosus: Secondary | ICD-10-CM

## 2023-05-17 DIAGNOSIS — Z8639 Personal history of other endocrine, nutritional and metabolic disease: Secondary | ICD-10-CM

## 2023-05-17 DIAGNOSIS — Z130 Encounter for screening for diseases of the blood and blood-forming organs and certain disorders involving the immune mechanism: Secondary | ICD-10-CM | POA: Diagnosis not present

## 2023-05-17 LAB — LIPID PANEL
Cholesterol: 223 mg/dL — ABNORMAL HIGH (ref 0–200)
HDL: 65.7 mg/dL (ref >39.00)
LDL Cholesterol: 146 mg/dL — ABNORMAL HIGH (ref 0–99)
NonHDL: 157.79
Total CHOL/HDL Ratio: 3
Triglycerides: 60 mg/dL (ref 0.0–149.0)
VLDL: 12 mg/dL (ref 0.0–40.0)

## 2023-05-17 LAB — COMPREHENSIVE METABOLIC PANEL
ALT: 12 U/L (ref 0–35)
AST: 16 U/L (ref 0–37)
Albumin: 4.6 g/dL (ref 3.5–5.2)
Alkaline Phosphatase: 35 U/L — ABNORMAL LOW (ref 39–117)
BUN: 10 mg/dL (ref 6–23)
CO2: 30 meq/L (ref 19–32)
Calcium: 9.4 mg/dL (ref 8.4–10.5)
Chloride: 102 meq/L (ref 96–112)
Creatinine, Ser: 0.87 mg/dL (ref 0.40–1.20)
GFR: 86.43 mL/min (ref >60.00)
Glucose, Bld: 83 mg/dL (ref 70–99)
Potassium: 4.7 meq/L (ref 3.5–5.1)
Sodium: 139 meq/L (ref 135–145)
Total Bilirubin: 0.5 mg/dL (ref 0.2–1.2)
Total Protein: 7 g/dL (ref 6.0–8.3)

## 2023-05-17 LAB — CBC
HCT: 42.3 % (ref 36.0–46.0)
Hemoglobin: 14.1 g/dL (ref 12.0–15.0)
MCHC: 33.4 g/dL (ref 30.0–36.0)
MCV: 91.2 fL (ref 78.0–100.0)
Platelets: 269 10*3/uL (ref 150.0–400.0)
RBC: 4.64 Mil/uL (ref 3.87–5.11)
RDW: 12.1 % (ref 11.5–15.5)
WBC: 5 10*3/uL (ref 4.0–10.5)

## 2023-05-17 LAB — VITAMIN D 25 HYDROXY (VIT D DEFICIENCY, FRACTURES): VITD: 28.27 ng/mL — ABNORMAL LOW (ref 30.00–100.00)

## 2023-05-17 LAB — TSH: TSH: 1.73 u[IU]/mL (ref 0.35–5.50)

## 2023-05-17 LAB — VITAMIN B12: Vitamin B-12: >1537 pg/mL — ABNORMAL HIGH (ref 211–911)

## 2023-05-17 LAB — HEMOGLOBIN A1C: Hgb A1c MFr Bld: 5.5 % (ref 4.6–6.5)

## 2023-05-17 LAB — FERRITIN: Ferritin: 22.4 ng/mL (ref 10.0–291.0)

## 2023-05-22 ENCOUNTER — Other Ambulatory Visit: Payer: Self-pay | Admitting: Family Medicine

## 2023-05-22 DIAGNOSIS — K219 Gastro-esophageal reflux disease without esophagitis: Secondary | ICD-10-CM

## 2023-05-31 ENCOUNTER — Other Ambulatory Visit (HOSPITAL_BASED_OUTPATIENT_CLINIC_OR_DEPARTMENT_OTHER): Payer: Self-pay

## 2023-05-31 MED ORDER — DOXYCYCLINE HYCLATE 100 MG PO TABS
100.0000 mg | ORAL_TABLET | Freq: Two times a day (BID) | ORAL | 0 refills | Status: DC
  Filled 2023-05-31: qty 14, 7d supply, fill #0

## 2023-05-31 MED ORDER — AMOXICILLIN-POT CLAVULANATE 875-125 MG PO TABS
1.0000 | ORAL_TABLET | Freq: Two times a day (BID) | ORAL | 0 refills | Status: DC
  Filled 2023-05-31: qty 14, 7d supply, fill #0

## 2023-06-08 ENCOUNTER — Ambulatory Visit: Payer: Managed Care, Other (non HMO)

## 2023-06-08 ENCOUNTER — Ambulatory Visit: Payer: Managed Care, Other (non HMO) | Admitting: Family Medicine

## 2023-06-08 ENCOUNTER — Encounter: Payer: Self-pay | Admitting: Family Medicine

## 2023-06-08 VITALS — BP 124/65 | HR 106 | Temp 98.0°F | Ht 67.0 in | Wt 138.0 lb

## 2023-06-08 DIAGNOSIS — R051 Acute cough: Secondary | ICD-10-CM

## 2023-06-08 DIAGNOSIS — J189 Pneumonia, unspecified organism: Secondary | ICD-10-CM

## 2023-06-08 DIAGNOSIS — Z8701 Personal history of pneumonia (recurrent): Secondary | ICD-10-CM

## 2023-06-08 NOTE — Patient Instructions (Signed)
[facility]: [address] [place], [place] [address]

## 2023-06-08 NOTE — Progress Notes (Signed)
Acute Office Visit  Subjective:     Patient ID: Kimberly Blankenship, female    DOB: Jul 26, 1985, 37 y.o.   MRN: [id number]  Chief Complaint  Patient presents with   Pneumonia    HPI Patient is in today for pneumonia follow-up.   Discussed the use of AI scribe software for clinical note transcription with the patient, who gave verbal consent to proceed.  History of Present Illness   The patient, recently diagnosed with pneumonia, presented for a follow-up visit. They had been seen at [facility] on the 11th of the previous month, where a chest x-ray revealed left lower lobe pneumonia and a small pleural effusion. The patient reported persistent coughing and a gradual improvement in energy levels since the initial diagnosis. They had been feeling unwell for approximately a week and a half prior to the [facility] visit.  The patient was prescribed Augmentin and doxycycline, which they completed two days prior to the current visit. They reported that their spouse also had pneumonia. Initially, they were given cough suppressants, but were later advised to stop taking them to facilitate expectoration. The patient's cough was productive of mucus, but there was no hemoptysis.  The patient's heart rate was slightly tachycardic, and their oxygen saturation was 95% at the time of the visit. They reported a gradual improvement in their breathing and were able to perform household chores and navigate stairs without significant difficulty. However, they were still not back to their baseline health status. They reported no fever since starting antibiotics.           ROS All review of systems negative except what is listed in the HPI      Objective:    BP 124/65   Pulse (!) 106   Temp 98 F (36.7 C) (Oral)   Ht 5\' 7"  (1.702 m)   Wt 138 lb (62.6 kg)   SpO2 95%   BMI 21.61 kg/m    Physical Exam Vitals reviewed.  Constitutional:      Appearance: Normal appearance.  HENT:     Head:  Normocephalic and atraumatic.     Nose: Congestion present.  Cardiovascular:     Rate and Rhythm: Normal rate and regular rhythm.     Heart sounds: Normal heart sounds.  Pulmonary:     Effort: Pulmonary effort is normal.     Breath sounds: Normal breath sounds. No wheezing, rhonchi or rales.     Comments: L mildly diminished ; dry cough Skin:    General: Skin is warm and dry.  Neurological:     Mental Status: She is alert and oriented to person, place, and time.  Psychiatric:        Mood and Affect: Mood normal.        Behavior: Behavior normal.        Thought Content: Thought content normal.        Judgment: Judgment normal.       No results found for any visits on 06/08/23.      Assessment & Plan:   Problem List Items Addressed This Visit   None Visit Diagnoses       History of recent pneumonia    -  Primary   Relevant Orders   DG Chest 2 View     Acute cough       Relevant Orders   DG Chest 2 View       Recent left lower lobe pneumonia with small pleural effusion. Completed a course  of Doxycycline and Augmentin. Still experiencing cough and diminished energy, but overall improving. No fever since starting antibiotics.  -Order repeat chest x-ray to assess for resolution of pneumonia and pleural effusion. -Advise patient to monitor heart rate and oxygen saturation at home. -Return to clinic or go to hospital for severe symptoms.  -Continue supportive measures       No orders of the defined types were placed in this encounter.   Return if symptoms worsen or fail to improve.  Clayborne Dana, NP

## 2023-06-17 ENCOUNTER — Encounter: Payer: Self-pay | Admitting: Family Medicine

## 2023-06-17 DIAGNOSIS — Z8701 Personal history of pneumonia (recurrent): Secondary | ICD-10-CM

## 2023-06-19 ENCOUNTER — Encounter: Payer: Self-pay | Admitting: Family Medicine

## 2023-06-19 NOTE — Telephone Encounter (Signed)
This was just read yesterday.   IMPRESSION: Left lower lung pneumonia. Recommend radiographic follow-up after medical therapy to document resolution.   *Copying PCP since Ladona Ridgel out of office

## 2023-06-27 ENCOUNTER — Encounter: Payer: Self-pay | Admitting: Family Medicine

## 2023-06-27 DIAGNOSIS — F411 Generalized anxiety disorder: Secondary | ICD-10-CM

## 2023-06-28 ENCOUNTER — Telehealth: Payer: Self-pay

## 2023-06-28 MED ORDER — DESVENLAFAXINE SUCCINATE ER 50 MG PO TB24: 50.0000 mg | ORAL_TABLET | Freq: Every day | ORAL | 3 refills | Status: AC

## 2023-06-28 NOTE — Addendum Note (Signed)
 Addended by: Abbe Amsterdam C on: 06/28/2023 12:16 PM   Modules accepted: Orders

## 2023-06-28 NOTE — Telephone Encounter (Signed)
 PA initiated via Covermymeds; KEY: [id number].  PA approved.   [id number];Review Type:Prior Auth;Coverage Start Date:06/21/2023;Coverage End Date:06/27/2024;

## 2023-07-03 NOTE — Addendum Note (Signed)
 Addended by: Abbe Amsterdam C on: 07/03/2023 12:23 PM   Modules accepted: Orders

## 2023-07-07 ENCOUNTER — Ambulatory Visit: Payer: Managed Care, Other (non HMO)

## 2023-07-07 ENCOUNTER — Encounter: Payer: Self-pay | Admitting: Family Medicine

## 2023-07-07 DIAGNOSIS — Z09 Encounter for follow-up examination after completed treatment for conditions other than malignant neoplasm: Secondary | ICD-10-CM

## 2023-07-07 DIAGNOSIS — Z8701 Personal history of pneumonia (recurrent): Secondary | ICD-10-CM

## 2023-08-11 ENCOUNTER — Other Ambulatory Visit: Payer: Self-pay

## 2023-08-11 ENCOUNTER — Other Ambulatory Visit (HOSPITAL_BASED_OUTPATIENT_CLINIC_OR_DEPARTMENT_OTHER): Payer: Self-pay

## 2023-08-11 ENCOUNTER — Other Ambulatory Visit: Payer: Self-pay | Admitting: Family Medicine

## 2023-08-11 DIAGNOSIS — G43809 Other migraine, not intractable, without status migrainosus: Secondary | ICD-10-CM

## 2023-08-11 MED ORDER — IPRATROPIUM BROMIDE 0.06 % NA SOLN
NASAL | 11 refills | Status: AC
  Filled 2023-08-11: qty 15, 30d supply, fill #0
  Filled 2023-09-08: qty 15, 30d supply, fill #1
  Filled 2024-06-17: qty 15, 30d supply, fill #2

## 2023-08-11 MED ORDER — BUTALBITAL-ACETAMINOPHEN 50-325 MG PO TABS
1.0000 | ORAL_TABLET | ORAL | 1 refills | Status: DC | PRN
  Filled 2023-08-11: qty 30, 5d supply, fill #0
  Filled 2023-12-21: qty 30, 5d supply, fill #1

## 2023-08-14 ENCOUNTER — Other Ambulatory Visit (HOSPITAL_BASED_OUTPATIENT_CLINIC_OR_DEPARTMENT_OTHER): Payer: Self-pay

## 2023-09-08 ENCOUNTER — Other Ambulatory Visit: Payer: Self-pay

## 2023-12-27 ENCOUNTER — Telehealth: Payer: Self-pay | Admitting: Internal Medicine

## 2023-12-27 NOTE — Telephone Encounter (Signed)
 Good morning Dr. Federico  The following patient is requesting to transfer to our clinic for IBS issues. She has been a patient of [facility] but she is unsatisfied because she feels they are not resolving her symptoms, just running expensive tests. Records are available on Epic. Please review and advise of scheduling. Thank you.

## 2023-12-27 NOTE — Telephone Encounter (Signed)
 error

## 2023-12-28 NOTE — Telephone Encounter (Signed)
 Good morning Dr. Albertus   Are you will to take this patient back under your care. Please advise to schedule. Thank you.

## 2024-01-02 NOTE — Telephone Encounter (Signed)
 Ok for appt with Dr. Federico LAZIER

## 2024-01-05 ENCOUNTER — Encounter: Payer: Self-pay | Admitting: Gastroenterology

## 2024-01-05 NOTE — Telephone Encounter (Signed)
 Left VM for patient to be scheduled

## 2024-02-29 ENCOUNTER — Ambulatory Visit: Admitting: Gastroenterology

## 2024-03-21 ENCOUNTER — Encounter: Payer: Self-pay | Admitting: Family Medicine

## 2024-03-25 ENCOUNTER — Ambulatory Visit: Admitting: Family Medicine

## 2024-04-19 NOTE — Progress Notes (Signed)
 04/22/2024 Kimberly Blankenship [id number] 11-Aug-1985  Referring provider: Watt Harlene BROCKS, MD Primary GI doctor: Dr. Albertus  ASSESSMENT AND PLAN:  IBS diarrhea 04/2018 colonoscopy for diarrhea showed normal TI/colon, negative microscopic colitis 2019 Negative celiac, SIBO 04/2023 CTE No radiographic of IBD, 4.3 left ovarian cyst Since May has had mucus/bile/liquid stools over last 9-12 months, has incomplete Bm's, some urgency, gas/cramping She is seeing pelvic floor PT but only 1 visit IBGard helped some, fiber helped some but cause worsening gas with psyllium husk No red flag symptoms, previous normal work up Most like pelvic floor dysfunction which she is seeing PT but is only had 1 visit versus likely IBS -KUB to evaluate for stool burden/obstruction  -CRP/ESR to rule out inflammation -Add on citracel/benefiber, FODMAP, trial off lactulose and lifestyle changes discussed - likely pelvic floor component, consider continue PT, -Consider  xifaxin trial pending results - Consider anorectal manometry  GERD 05/04/2017 RUQ US  unremarkable 05/17/2023 unremarkable liver function alk phos low at 35 2019 Negative HBT for SIBO, celiac Unremarkable for GERD, dysphagia, melena  Anemia in setting of menorrhagia 05/17/2023  HGB 14.1 MCV 91.2 Platelets 269.0 Recent Labs    05/17/23 0934  HGB 14.1  Resolved continue to monitor   Patient Care Team: Copland, Harlene BROCKS, MD as PCP - General (Family Medicine)  HISTORY OF PRESENT ILLNESS: 38 y.o. female with a past medical history listed below presents for evaluation of abnormal bowel habits.   Remotely seen in 2021 by Kimberly Blankenship for IBS mixed, seen last 03/2023 Dr. Candi at [facility].  Discussed the use of AI scribe software for clinical note transcription with the patient, who gave verbal consent to proceed.  History of Present Illness   Kimberly Blankenship is a 38 year old female with IBS who presents with ongoing gastrointestinal  symptoms.  She has a history of IBS, initially characterized by constipation with occasional overflow diarrhea, diagnosed after a colonoscopy in 2019. Since May 2023, her symptoms have shifted to predominantly loose stools, described as 'loose diarrhea-like stools most of the time' with occasional constipation. She experiences gassy, cramping feelings.  For the past 9 to 12 months, she has experienced incomplete bowel movements daily, with some urgency. She notes difficulty passing gas unless leaning forward and reports a combination of gas and stool during bowel movements. She has tried Ibogard without significant improvement and previously tried rifaximin , which she does not recall being effective.  Her past workup includes a negative SIBO and celiac test, a CT scan showing no abnormalities, and a CT enterography in November 2024 revealing a left ovarian cyst but no signs of inflammation or IBD. She has a history of endometriosis, discovered during surgery for a dermoid cyst.  She reports no urinary issues but experiences pelvic floor pressure and is seeing a pelvic floor therapist. She continues to experience abdominal discomfort and concerns about gas and stool control, particularly during sex.  Her current medications include KD, B12, and Pristiq , which she has been on for a long time. She has tried dietary changes, including increased fiber intake with psyllium husk and lentils, which have provided some relief. She admits to not having the best diet regarding fruits and vegetables.  No recent illness, changes in medication, or new supplements. No fevers, chills, weight loss, black stool, or blood in the stool. She experiences bloating about four days a week, with no clear dietary correlation.        She  reports that she has never smoked.  She has never used smokeless tobacco. She reports current alcohol use of about 1.0 standard drink of alcohol per week. She reports that she does not use  drugs.  RELEVANT GI HISTORY, IMAGING AND LABS: Results   LABS SIBO test: Negative Celiac test: Negative  RADIOLOGY CT enterography: Left ovarian cyst, no radiographic evidence of inflammation or IBD (04/2023)  DIAGNOSTIC Colonoscopy: Normal (2019)      CBC    Component Value Date/Time   WBC 5.0 05/17/2023 0934   RBC 4.64 05/17/2023 0934   HGB 14.1 05/17/2023 0934   HGB 13.9 11/07/2017 1610   HCT 42.3 05/17/2023 0934   HCT 40.1 11/07/2017 1610   PLT 269.0 05/17/2023 0934   PLT 193 11/07/2017 1610   MCV 91.2 05/17/2023 0934   MCV 87 11/07/2017 1610   MCH 30.3 11/07/2017 1610   MCHC 33.4 05/17/2023 0934   RDW 12.1 05/17/2023 0934   RDW 12.7 11/07/2017 1610   LYMPHSABS 1.9 05/23/2017 0924   LYMPHSABS 1.5 01/30/2017 1502   MONOABS 0.5 05/23/2017 0924   EOSABS 0.1 05/23/2017 0924   EOSABS 0.0 01/30/2017 1502   BASOSABS 0.0 05/23/2017 0924   BASOSABS 0.0 01/30/2017 1502   Recent Labs    05/17/23 0934  HGB 14.1    CMP     Component Value Date/Time   NA 139 05/17/2023 0934   NA 141 01/30/2017 1502   K 4.7 05/17/2023 0934   CL 102 05/17/2023 0934   CO2 30 05/17/2023 0934   GLUCOSE 83 05/17/2023 0934   BUN 10 05/17/2023 0934   BUN 10 01/30/2017 1502   CREATININE 0.87 05/17/2023 0934   CALCIUM 9.4 05/17/2023 0934   PROT 7.0 05/17/2023 0934   PROT 7.8 01/30/2017 1502   ALBUMIN 4.6 05/17/2023 0934   ALBUMIN 4.8 01/30/2017 1502   AST 16 05/17/2023 0934   ALT 12 05/17/2023 0934   ALKPHOS 35 (L) 05/17/2023 0934   BILITOT 0.5 05/17/2023 0934   BILITOT 0.2 01/30/2017 1502   GFRNONAA 92 01/30/2017 1502   GFRAA 106 01/30/2017 1502      Latest Ref Rng & Units 05/17/2023    9:34 AM 11/02/2022    3:13 PM 05/09/2022    9:15 AM  Hepatic Function  Total Protein 6.0 - 8.3 g/dL 7.0  7.1  6.8   Albumin 3.5 - 5.2 g/dL 4.6  4.6  4.4   AST 0 - 37 U/L 16  16  15    ALT 0 - 35 U/L 12  14  14    Alk Phosphatase 39 - 117 U/L 35  37  32   Total Bilirubin 0.2 - 1.2 mg/dL 0.5   0.4  0.7       Current Medications:     Current Outpatient Medications (Respiratory):    albuterol  (VENTOLIN  HFA) 108 (90 Base) MCG/ACT inhaler, Inhale 2 puffs by mouth into the lungs every 6 (six) hours as needed for wheezing.   ipratropium (ATROVENT ) 0.06 % nasal spray, Administer 2 sprays into affected nostril in the morning and at bedtime for 30 days.  Current Outpatient Medications (Analgesics):    ACETAMINOPHEN -BUTALBITAL  50-325 MG TABS, Take 1-2 tablets by mouth every 4 (four) hours as needed for headaches. Maximum daily dose of 6 tablets.   ibuprofen  (ADVIL ) 400 MG tablet, Take 2 tablets by mouth every 6 - 8 hours as needed  Current Outpatient Medications (Hematological):    ferrous sulfate 325 (65 FE) MG EC tablet, Take 325 mg by mouth daily.  vitamin B-12 (CYANOCOBALAMIN ) 1000 MCG tablet, Take 1,000 mcg by mouth daily.  Current Outpatient Medications (Other):    ALPRAZolam  (XANAX ) 0.25 MG tablet, Take 1 tablet (0.25 mg total) by mouth 2 (two) times daily as needed for anxiety.   Ascorbic Acid (VITAMIN C) 500 MG CAPS, Take by mouth daily.   calcium carbonate (TUMS - DOSED IN MG ELEMENTAL CALCIUM) 500 MG chewable tablet, Chew 1 tablet by mouth as needed for indigestion or heartburn.   Cholecalciferol (VITAMIN D3) 5000 units CAPS, Take 1 capsule by mouth daily.   desvenlafaxine  (PRISTIQ ) 50 MG 24 hr tablet, Take 1 tablet (50 mg total) by mouth daily.   ketoconazole  (NIZORAL ) 2 % cream, Apply to affected area(s) every night at bedtime from neck down to waist and wrist 3 nights in a row and then repeat one night out of each month   Menaquinone-7 (VITAMIN K2) 100 MCG CAPS, Take 1 tablet by mouth daily.   pantoprazole  (PROTONIX ) 40 MG tablet, Take 1 tablet (40 mg total) by mouth daily.   tretinoin  (RETIN-A ) 0.05 % cream, Apply 1(one) application(s) topical every night at bedtime   triamcinolone  ointment (KENALOG ) 0.1 %, Apply 1(one) application(s) topical 2(two) times a day    UNABLE TO FIND, Med Name: cholorophyll  once daily with copper  50mg   Medical History:  Past Medical History:  Diagnosis Date   Anxiety    Depression    Endometriosis determined by laparoscopy 04/25/2019   GERD (gastroesophageal reflux disease)    occasional   Headache, migraine    IBS (irritable bowel syndrome)    IDA (iron deficiency anemia)    Left ovarian cyst    Allergies:  Allergies  Allergen Reactions   Sulfa Antibiotics Itching and Rash     Surgical History:  She  has a past surgical history that includes wisdom teeth; No past surgeries; and Lysis of adhesion (N/A, 04/25/2019). Family History:  Her family history includes Arthritis in her maternal grandmother, mother, and paternal grandmother; COPD in her paternal grandmother; Depression in her brother; Diabetes in her mother; Fibroids in her maternal grandmother and mother; Hypertension in her maternal grandfather; Miscarriages / Stillbirths in her mother; Other in her paternal grandfather; Stomach cancer in her maternal grandfather; Uterine cancer in her paternal aunt.  REVIEW OF SYSTEMS  : All other systems reviewed and negative except where noted in the History of Present Illness.  PHYSICAL EXAM: BP (!) 100/56   Pulse 70   Ht 5' 7 (1.702 m)   Wt 141 lb 1 oz (64 kg)   LMP 04/08/2024   BMI 22.09 kg/m  Physical Exam   GENERAL APPEARANCE: Well nourished, in no apparent distress. HEENT: No cervical lymphadenopathy, unremarkable thyroid , sclerae anicteric, conjunctiva pink. RESPIRATORY: Respiratory effort normal, breath sounds equal bilaterally without rales, rhonchi, or wheezing. CARDIO: Regular rate and rhythm with no murmurs, rubs, or gallops, peripheral pulses intact. ABDOMEN: Soft, non-distended, active bowel sounds in all four quadrants, no tenderness to palpation, no rebound, no mass appreciated. RECTAL: Increased rectal tone, internal hemorrhoid present, negative for blood. MUSCULOSKELETAL: Full range of motion,  normal gait, without edema. SKIN: Dry, intact without rashes or lesions. No jaundice. NEURO: Alert, oriented, no focal deficits. PSYCH: Cooperative, normal mood and affect.      Alan JONELLE Coombs, PA-C 4:03 PM

## 2024-04-22 ENCOUNTER — Encounter: Payer: Self-pay | Admitting: Physician Assistant

## 2024-04-22 ENCOUNTER — Other Ambulatory Visit

## 2024-04-22 ENCOUNTER — Ambulatory Visit: Admitting: Physician Assistant

## 2024-04-22 ENCOUNTER — Ambulatory Visit
Admission: RE | Admit: 2024-04-22 | Discharge: 2024-04-22 | Disposition: A | Source: Ambulatory Visit | Attending: Physician Assistant | Admitting: Physician Assistant

## 2024-04-22 VITALS — BP 100/56 | HR 70 | Ht 67.0 in | Wt 141.1 lb

## 2024-04-22 DIAGNOSIS — K219 Gastro-esophageal reflux disease without esophagitis: Secondary | ICD-10-CM | POA: Diagnosis not present

## 2024-04-22 DIAGNOSIS — M6289 Other specified disorders of muscle: Secondary | ICD-10-CM | POA: Diagnosis not present

## 2024-04-22 DIAGNOSIS — K580 Irritable bowel syndrome with diarrhea: Secondary | ICD-10-CM

## 2024-04-22 LAB — SEDIMENTATION RATE: Sed Rate: 1 mm/h (ref 0–20)

## 2024-04-22 LAB — C-REACTIVE PROTEIN: CRP: <0.5 mg/dL (ref 0.5–20.0)

## 2024-04-22 NOTE — Patient Instructions (Signed)
 Your provider has requested that you go to the basement level for lab work before leaving today. Press B on the elevator. The lab is located at the first door on the left as you exit the elevator.  Your provider has requested that you have an abdominal x ray before leaving today. Please go to the basement floor to our Radiology department for the test.  FIBER SUPPLEMENT You can do metamucil or fibercon once or twice a day but if this causes gas/bloating please switch to Benefiber or Citracel.  Fiber is good for constipation/diarrhea/irritable bowel syndrome.  It can also help with weight loss and can help lower your bad cholesterol (LDL).  Please do 1 TBSP in the morning in water, coffee, or tea.  It can take up to a month before you can see a difference with your bowel movements.  It is cheapest from [employer], [employer], [employer].   First do a trial off milk/lactose products if you use them.  Add fiber like benefiber or citracel once a day Increase activity Can do trial of IBGard which is over the counter for AB pain- Take 1-2 capsules once a day for maintence or twice a day during a flare    FODMAP stands for fermentable oligo-, di-, mono-saccharides and polyols (1). These are the scientific terms used to classify groups of carbs that are difficult for our body to digest and that are notorious for triggering digestive symptoms like bloating, gas, loose stools and stomach pain.   You can try low FODMAP diet  - start with eliminating just one column at a time that you feel may be a trigger for you. - the table at the very bottom contains foods that are low in FODMAPs   Sometimes trying to eliminate the FODMAP's from your diet is difficult or tricky, if you are stuggling with trying to do the elimination diet you can try an enzyme.  There is a food enzymes that you sprinkle in or on your food that helps break down the FODMAP. You can read more about the enzyme by going to this  site: https://fodzyme.com/

## 2024-04-23 ENCOUNTER — Ambulatory Visit: Payer: Self-pay | Admitting: Physician Assistant

## 2024-06-17 ENCOUNTER — Other Ambulatory Visit: Payer: Self-pay

## 2024-06-17 ENCOUNTER — Other Ambulatory Visit: Payer: Self-pay | Admitting: Family Medicine

## 2024-06-17 DIAGNOSIS — G43809 Other migraine, not intractable, without status migrainosus: Secondary | ICD-10-CM

## 2024-06-17 MED ORDER — BUTALBITAL-ACETAMINOPHEN 50-325 MG PO TABS
1.0000 | ORAL_TABLET | ORAL | 1 refills | Status: AC | PRN
  Filled 2024-06-17: qty 30, 3d supply, fill #0

## 2024-06-18 ENCOUNTER — Other Ambulatory Visit (HOSPITAL_BASED_OUTPATIENT_CLINIC_OR_DEPARTMENT_OTHER): Payer: Self-pay

## 2024-06-27 ENCOUNTER — Other Ambulatory Visit: Payer: Self-pay

## 2024-06-27 DIAGNOSIS — K6289 Other specified diseases of anus and rectum: Secondary | ICD-10-CM

## 2024-06-27 MED ORDER — AMBULATORY NON FORMULARY MEDICATION: 1 refills | Status: AC

## 2024-07-15 ENCOUNTER — Ambulatory Visit: Admitting: Physician Assistant

## 2024-07-26 NOTE — Progress Notes (Unsigned)
 "    07/26/2024 Kimberly Blankenship April 18, 1986  Referring provider: Watt Harlene BROCKS, MD Primary GI doctor: Dr. Albertus  ASSESSMENT AND PLAN:  IBS diarrhea 04/2018 colonoscopy for diarrhea showed normal TI/colon, negative microscopic colitis 2019 Negative celiac, SIBO 04/2023 CTE No radiographic of IBD, 4.3 left ovarian cyst 04/22/2024 KUB moderate stool burden 04/22/2024 sed rate CRP normal No red flag symptoms, previous normal work up Most like pelvic floor dysfunction which she is seeing PT but is only had 1 visit versus likely IBS -MiraLAX/fiber, consider Linzess  -Add on citracel/benefiber, FODMAP, trial off lactulose and lifestyle changes discussed - likely pelvic floor component, continue PT -Consider  xifaxin trial pending results - Consider anorectal manometry  GERD 05/04/2017 RUQ US  unremarkable 05/17/2023 unremarkable liver function alk phos low at 35 2019 Negative HBT for SIBO, celiac Unremarkable for GERD, dysphagia, melena  Anemia in setting of menorrhagia 05/17/2023  HGB 14.1 MCV 91.2 Platelets 269.0 Resolved continue to monitor   Patient Care Team: Copland, Harlene BROCKS, MD as PCP - General (Family Medicine)  HISTORY OF PRESENT ILLNESS: 39 y.o. female with a past medical history listed below presents for evaluation of abnormal bowel habits.   I last saw the patient in the office 04/22/2024 for diarrhea.  Discussed the use of AI scribe software for clinical note transcription with the patient, who gave verbal consent to proceed.  History of Present Illness            She  reports that she has never smoked. She has never used smokeless tobacco. She reports current alcohol use of about 1.0 standard drink of alcohol per week. She reports that she does not use drugs.  RELEVANT GI HISTORY, IMAGING AND LABS: Results          CBC    Component Value Date/Time   WBC 5.0 05/17/2023 0934   RBC 4.64 05/17/2023 0934   HGB 14.1 05/17/2023 0934   HGB 13.9  11/07/2017 1610   HCT 42.3 05/17/2023 0934   HCT 40.1 11/07/2017 1610   PLT 269.0 05/17/2023 0934   PLT 193 11/07/2017 1610   MCV 91.2 05/17/2023 0934   MCV 87 11/07/2017 1610   MCH 30.3 11/07/2017 1610   MCHC 33.4 05/17/2023 0934   RDW 12.1 05/17/2023 0934   RDW 12.7 11/07/2017 1610   LYMPHSABS 1.9 05/23/2017 0924   LYMPHSABS 1.5 01/30/2017 1502   MONOABS 0.5 05/23/2017 0924   EOSABS 0.1 05/23/2017 0924   EOSABS 0.0 01/30/2017 1502   BASOSABS 0.0 05/23/2017 0924   BASOSABS 0.0 01/30/2017 1502   No results for input(s): HGB in the last 8760 hours.   CMP     Component Value Date/Time   NA 139 05/17/2023 0934   NA 141 01/30/2017 1502   K 4.7 05/17/2023 0934   CL 102 05/17/2023 0934   CO2 30 05/17/2023 0934   GLUCOSE 83 05/17/2023 0934   BUN 10 05/17/2023 0934   BUN 10 01/30/2017 1502   CREATININE 0.87 05/17/2023 0934   CALCIUM 9.4 05/17/2023 0934   PROT 7.0 05/17/2023 0934   PROT 7.8 01/30/2017 1502   ALBUMIN 4.6 05/17/2023 0934   ALBUMIN 4.8 01/30/2017 1502   AST 16 05/17/2023 0934   ALT 12 05/17/2023 0934   ALKPHOS 35 (L) 05/17/2023 0934   BILITOT 0.5 05/17/2023 0934   BILITOT 0.2 01/30/2017 1502   GFRNONAA 92 01/30/2017 1502   GFRAA 106 01/30/2017 1502      Latest Ref Rng & Units 05/17/2023  9:34 AM 11/02/2022    3:13 PM 05/09/2022    9:15 AM  Hepatic Function  Total Protein 6.0 - 8.3 g/dL 7.0  7.1  6.8   Albumin 3.5 - 5.2 g/dL 4.6  4.6  4.4   AST 0 - 37 U/L 16  16  15    ALT 0 - 35 U/L 12  14  14    Alk Phosphatase 39 - 117 U/L 35  37  32   Total Bilirubin 0.2 - 1.2 mg/dL 0.5  0.4  0.7       Current Medications:   Current Outpatient Medications (Respiratory):    albuterol  (VENTOLIN  HFA) 108 (90 Base) MCG/ACT inhaler, Inhale 2 puffs by mouth into the lungs every 6 (six) hours as needed for wheezing.   ipratropium (ATROVENT ) 0.06 % nasal spray, Administer 2 sprays into affected nostril in the morning and at bedtime for 30 days.  Current Outpatient  Medications (Analgesics):    ACETAMINOPHEN -BUTALBITAL  50-325 MG TABS, Take 1-2 tablets by mouth every 4 (four) hours as needed for headaches. Maximum daily dose of 6 tablets.   ibuprofen  (ADVIL ) 400 MG tablet, Take 2 tablets by mouth every 6 - 8 hours as needed  Current Outpatient Medications (Hematological):    ferrous sulfate 325 (65 FE) MG EC tablet, Take 325 mg by mouth daily.   vitamin B-12 (CYANOCOBALAMIN ) 1000 MCG tablet, Take 1,000 mcg by mouth daily.  Current Outpatient Medications (Other):    ALPRAZolam  (XANAX ) 0.25 MG tablet, Take 1 tablet (0.25 mg total) by mouth 2 (two) times daily as needed for anxiety.   AMBULATORY NON FORMULARY MEDICATION, Medication Name: Diltiazem 2%/Lidocaine  2% Using your index finger apply a small amount of medication inside the anal opening and to the external anal area twice daily x 6 weeks.   Ascorbic Acid (VITAMIN C) 500 MG CAPS, Take by mouth daily.   calcium carbonate (TUMS - DOSED IN MG ELEMENTAL CALCIUM) 500 MG chewable tablet, Chew 1 tablet by mouth as needed for indigestion or heartburn.   Cholecalciferol (VITAMIN D3) 5000 units CAPS, Take 1 capsule by mouth daily.   desvenlafaxine  (PRISTIQ ) 50 MG 24 hr tablet, Take 1 tablet (50 mg total) by mouth daily.   ketoconazole  (NIZORAL ) 2 % cream, Apply to affected area(s) every night at bedtime from neck down to waist and wrist 3 nights in a row and then repeat one night out of each month   Menaquinone-7 (VITAMIN K2) 100 MCG CAPS, Take 1 tablet by mouth daily.   pantoprazole  (PROTONIX ) 40 MG tablet, Take 1 tablet (40 mg total) by mouth daily.   tretinoin  (RETIN-A ) 0.05 % cream, Apply 1(one) application(s) topical every night at bedtime   triamcinolone  ointment (KENALOG ) 0.1 %, Apply 1(one) application(s) topical 2(two) times a day   UNABLE TO FIND, Med Name: cholorophyll  once daily with copper  50mg   Medical History:  Past Medical History:  Diagnosis Date   Anxiety    Depression    Endometriosis  determined by laparoscopy 04/25/2019   GERD (gastroesophageal reflux disease)    occasional   Headache, migraine    IBS (irritable bowel syndrome)    IDA (iron deficiency anemia)    Left ovarian cyst    Allergies:  Allergies  Allergen Reactions   Sulfa Antibiotics Itching and Rash     Surgical History:  She  has a past surgical history that includes wisdom teeth; No past surgeries; and Lysis of adhesion (N/A, 04/25/2019). Family History:  Her family history includes Arthritis in  her maternal grandmother, mother, and paternal grandmother; COPD in her paternal grandmother; Depression in her brother; Diabetes in her mother; Fibroids in her maternal grandmother and mother; Hypertension in her maternal grandfather; Miscarriages / Stillbirths in her mother; Other in her paternal grandfather; Stomach cancer in her maternal grandfather; Uterine cancer in her paternal aunt.  REVIEW OF SYSTEMS  : All other systems reviewed and negative except where noted in the History of Present Illness.  PHYSICAL EXAM: There were no vitals taken for this visit. Physical Exam          Alan JONELLE Coombs, PA-C 11:04 AM   "

## 2024-07-29 ENCOUNTER — Ambulatory Visit: Admitting: Physician Assistant
